# Patient Record
Sex: Male | Born: 1985 | Race: Black or African American | Hispanic: No | Marital: Single | Smoking: Never smoker
Health system: Southern US, Community
[De-identification: ages and names within clinical notes are randomized; demographics above are authoritative.]

## PROBLEM LIST (undated history)

## (undated) DIAGNOSIS — Q909 Down syndrome, unspecified: Secondary | ICD-10-CM

## (undated) DIAGNOSIS — R569 Unspecified convulsions: Secondary | ICD-10-CM

## (undated) DIAGNOSIS — Z905 Acquired absence of kidney: Secondary | ICD-10-CM

## (undated) HISTORY — PX: ABDOMINAL SURGERY: SHX537

## (undated) HISTORY — DX: Acquired absence of kidney: Z90.5

## (undated) HISTORY — DX: Unspecified convulsions: R56.9

---

## 2014-11-10 ENCOUNTER — Emergency Department (HOSPITAL_COMMUNITY): Payer: Medicaid Other

## 2014-11-10 ENCOUNTER — Emergency Department (HOSPITAL_COMMUNITY)
Admission: EM | Admit: 2014-11-10 | Discharge: 2014-11-10 | Disposition: A | Discharge status: Home / Self Care | Payer: Medicaid Other | Attending: Emergency Medicine | Admitting: Emergency Medicine

## 2014-11-10 ENCOUNTER — Encounter (HOSPITAL_COMMUNITY): Payer: Self-pay | Admitting: Emergency Medicine

## 2014-11-10 DIAGNOSIS — Q909 Down syndrome, unspecified: Secondary | ICD-10-CM | POA: Insufficient documentation

## 2014-11-10 DIAGNOSIS — R569 Unspecified convulsions: Secondary | ICD-10-CM | POA: Diagnosis present

## 2014-11-10 HISTORY — DX: Down syndrome, unspecified: Q90.9

## 2014-11-10 LAB — COMPREHENSIVE METABOLIC PANEL
ALBUMIN: 3.9 g/dL (ref 3.5–5.2)
ALK PHOS: 80 U/L (ref 39–117)
ALT: 15 U/L (ref 0–53)
AST: 26 U/L (ref 0–37)
Anion gap: 9 (ref 5–15)
BILIRUBIN TOTAL: 0.7 mg/dL (ref 0.3–1.2)
BUN: 13 mg/dL (ref 6–23)
CHLORIDE: 103 meq/L (ref 96–112)
CO2: 26 mmol/L (ref 19–32)
Calcium: 9.4 mg/dL (ref 8.4–10.5)
Creatinine, Ser: 1.31 mg/dL (ref 0.50–1.35)
GFR calc Af Amer: 86 mL/min — ABNORMAL LOW (ref >90)
GFR calc non Af Amer: 74 mL/min — ABNORMAL LOW (ref >90)
Glucose, Bld: 126 mg/dL — ABNORMAL HIGH (ref 70–99)
POTASSIUM: 4.3 mmol/L (ref 3.5–5.1)
Sodium: 138 mmol/L (ref 135–145)
Total Protein: 7 g/dL (ref 6.0–8.3)

## 2014-11-10 LAB — CBC WITH DIFFERENTIAL/PLATELET
Basophils Absolute: 0 10*3/uL (ref 0.0–0.1)
Basophils Relative: 0 % (ref 0–1)
Eosinophils Absolute: 0.1 10*3/uL (ref 0.0–0.7)
Eosinophils Relative: 1 % (ref 0–5)
HCT: 38.1 % — ABNORMAL LOW (ref 39.0–52.0)
HEMOGLOBIN: 12.7 g/dL — AB (ref 13.0–17.0)
Lymphocytes Relative: 16 % (ref 12–46)
Lymphs Abs: 1 10*3/uL (ref 0.7–4.0)
MCH: 28.2 pg (ref 26.0–34.0)
MCHC: 33.3 g/dL (ref 30.0–36.0)
MCV: 84.7 fL (ref 78.0–100.0)
MONOS PCT: 9 % (ref 3–12)
Monocytes Absolute: 0.5 10*3/uL (ref 0.1–1.0)
NEUTROS ABS: 4.6 10*3/uL (ref 1.7–7.7)
NEUTROS PCT: 74 % (ref 43–77)
Platelets: 309 10*3/uL (ref 150–400)
RBC: 4.5 MIL/uL (ref 4.22–5.81)
RDW: 15.2 % (ref 11.5–15.5)
WBC: 6.1 10*3/uL (ref 4.0–10.5)

## 2014-11-10 LAB — URINALYSIS, ROUTINE W REFLEX MICROSCOPIC
BILIRUBIN URINE: NEGATIVE
GLUCOSE, UA: NEGATIVE mg/dL
HGB URINE DIPSTICK: NEGATIVE
KETONES UR: NEGATIVE mg/dL
Leukocytes, UA: NEGATIVE
Nitrite: NEGATIVE
PH: 7.5 (ref 5.0–8.0)
Protein, ur: NEGATIVE mg/dL
Specific Gravity, Urine: 1.004 — ABNORMAL LOW (ref 1.005–1.030)
Urobilinogen, UA: 0.2 mg/dL (ref 0.0–1.0)

## 2014-11-10 LAB — CBG MONITORING, ED: Glucose-Capillary: 125 mg/dL — ABNORMAL HIGH (ref 70–99)

## 2014-11-10 MED ORDER — LORAZEPAM 2 MG/ML IJ SOLN
1.0000 mg | Freq: Once | INTRAMUSCULAR | Status: AC
  Administered 2014-11-10: 1 mg via INTRAMUSCULAR
  Filled 2014-11-10: qty 1

## 2014-11-10 NOTE — Discharge Instructions (Signed)
Please follow up closely with neurologist for further evaluation of seizure, which may include an EEG.  Return to ER if symptom return or if you have any concerns.    Seizure, Adult A seizure is abnormal electrical activity in the brain. Seizures usually last from 30 seconds to 2 minutes. There are various types of seizures. Before a seizure, you may have a warning sensation (aura) that a seizure is about to occur. An aura may include the following symptoms:   Fear or anxiety.  Nausea.  Feeling like the room is spinning (vertigo).  Vision changes, such as seeing flashing lights or spots. Common symptoms during a seizure include:  A change in attention or behavior (altered mental status).  Convulsions with rhythmic jerking movements.  Drooling.  Rapid eye movements.  Grunting.  Loss of bladder and bowel control.  Bitter taste in the mouth.  Tongue biting. After a seizure, you may feel confused and sleepy. You may also have an injury resulting from convulsions during the seizure. HOME CARE INSTRUCTIONS   If you are given medicines, take them exactly as prescribed by your health care provider.  Keep all follow-up appointments as directed by your health care provider.  Do not swim or drive or engage in risky activity during which a seizure could cause further injury to you or others until your health care provider says it is OK.  Get adequate rest.  Teach friends and family what to do if you have a seizure. They should:  Lay you on the ground to prevent a fall.  Put a cushion under your head.  Loosen any tight clothing around your neck.  Turn you on your side. If vomiting occurs, this helps keep your airway clear.  Stay with you until you recover.  Know whether or not you need emergency care. SEEK IMMEDIATE MEDICAL CARE IF:  The seizure lasts longer than 5 minutes.  The seizure is severe or you do not wake up immediately after the seizure.  You have an altered  mental status after the seizure.  You are having more frequent or worsening seizures. Someone should drive you to the emergency department or call local emergency services (911 in [place] MAKE SURE YOU:  Understand these instructions.  Will watch your condition.  Will get help right away if you are not doing well or get worse. Document Released: 11/02/2000 Document Revised: 08/26/2013 Document Reviewed: 06/17/2013 [facility] Patient Information 2015 [employer], [place]. This information is not intended to replace advice given to you by your health care provider. Make sure you discuss any questions you have with your health care provider.

## 2014-11-10 NOTE — ED Notes (Signed)
Patient returned from X-ray 

## 2014-11-10 NOTE — ED Notes (Addendum)
EMS - Witnessed by mom, patient described as having a grand-mal seizure.  Patient fell to the floor from kitchen dinette on to a carpeted surface for approximately 5 minutes.  Patient had no incontinence and no mouth injury during the episode.  No history of seizures per EMS.  Patient has down syndrome.

## 2014-11-10 NOTE — ED Notes (Signed)
Reviewed discharge instructions with mother of patient at bedside.  Taken to the waiting room in a wheelchair.

## 2014-11-10 NOTE — ED Provider Notes (Signed)
CSN: [id number]     Arrival date & time 11/10/14  1020 History   First MD Initiated Contact with Patient 11/10/14 1020     No chief complaint on file.    (Consider location/radiation/quality/duration/timing/severity/associated sxs/prior Treatment) HPI   28 year old male with history of Down syndrome who was brought here via EMS accompanied by mom for evaluation of a seizure. Per mom, patient was sitting at mid breakfast table eating breakfast approximately an hour ago when he begins to shakes violently, making gargling sounds with his voice and subsequently fell off the chair. He had a seizure episodes lasting for approximately 5 minutes follow a post ictal period. Patient has no direct injury from the fall. Difficult to assess for urinary incontinence because pt has a urostomy site.  No tongue biting.  Patient is now back to his baseline. Per mom, patient has been behaving normally leading up to today. No recent medication change, no recent sickness, he has been eating and drinking and has been active at his baseline. No history of seizure in the past.  No past medical history on file. No past surgical history on file. No family history on file. History  Substance Use Topics  . Smoking status: Not on file  . Smokeless tobacco: Not on file  . Alcohol Use: Not on file    Review of Systems  Unable to perform ROS  history of Down syndrome    Allergies  Review of patient's allergies indicates not on file.  Home Medications   Prior to Admission medications   Not on File   There were no vitals taken for this visit. Physical Exam  Constitutional:  Body stature consistence with Down syndrome, appears to be in no acute distress, interactive, smiling, and moving all 4 extremities  HENT:  Facial skin is erythematous throughout however this is at his baseline.  No hemotympanum, no septal hematoma, no malocclusion, no midface tenderness, no tongue laceration. Poor dentition.  Eyes:  Conjunctivae and EOM are normal. Pupils are equal, round, and reactive to light.  Neck: Normal range of motion. Neck supple.  No  cervical spinal tenderness on exam.  Cardiovascular: Normal rate and regular rhythm.   Pulmonary/Chest: Effort normal and breath sounds normal. No respiratory distress. He has no wheezes.  Abdominal: Soft. Bowel sounds are normal. He exhibits no distension. There is no tenderness.  Patient with a urostomy site to suprapubic region with mild surrounding skin erythema but no frank cellulitis and nontender on palpation.  Musculoskeletal:  5/5 strength all 4 extremities, able to move all extremities without difficulty.  Neurological: He is alert.  Nursing note and vitals reviewed.   ED Course  Procedures (including critical care time)  10:48 AM Patient with history of Down syndrome here for a new onset seizure activities, which appears to be grand mal seizure. He is at his baseline. Workup initiated.  2:48 PM Patient has been at his baseline. He is resting comfortably. No recurrent seizure. UA shows no evidence of urinary tract infection, CBG unremarkable, blood work is reassuring, electrolytes are within normal limits, chest x-ray shows no evidence of infection, and a head CT scan is unremarkable. Plan to have patient follow-up with neurology for further evaluation of his seizure which may including an EEG. Family made aware that if seizure episodes return to bring patient back to the ER for further evaluation. Care discussed with Dr. Judd Lienelo.  Labs Review Labs Reviewed  CBC WITH DIFFERENTIAL - Abnormal; Notable for the following:  Hemoglobin 12.7 (*)    HCT 38.1 (*)    All other components within normal limits  COMPREHENSIVE METABOLIC PANEL - Abnormal; Notable for the following:    Glucose, Bld 126 (*)    GFR calc non Af Amer 74 (*)    GFR calc Af Amer 86 (*)    All other components within normal limits  URINALYSIS, ROUTINE W REFLEX MICROSCOPIC - Abnormal;  Notable for the following:    Color, Urine COLORLESS (*)    Specific Gravity, Urine 1.004 (*)    All other components within normal limits  CBG MONITORING, ED - Abnormal; Notable for the following:    Glucose-Capillary 125 (*)    All other components within normal limits    Imaging Review Dg Chest 2 View  11/10/2014   CLINICAL DATA:  Two week history of cough.  Seizure  EXAM: CHEST  2 VIEW  COMPARISON:  None.  FINDINGS: The lungs are clear. The heart size and pulmonary vascularity are normal. No adenopathy. No bone lesions. Rudimentary cervical ribs are noted bilaterally.  IMPRESSION: No edema or consolidation.   Electronically Signed   By: Bretta BangWilliam  Woodruff M.D.   On: 11/10/2014 13:25   Ct Head Wo Contrast  11/10/2014   CLINICAL DATA:  Grand mal seizure beginning today. No history of seizures prior to this. Down syndrome.  EXAM: CT HEAD WITHOUT CONTRAST  TECHNIQUE: Contiguous axial images were obtained from the base of the skull through the vertex without intravenous contrast.  COMPARISON:  None.  FINDINGS: The patient was unable to remain motionless for the exam. Small or subtle lesions could be overlooked.  No evidence for acute infarction, hemorrhage, mass lesion, hydrocephalus, or extra-axial fluid. No definite atrophy or white matter disease. Calvarium intact. No sinus air-fluid levels are observed.  IMPRESSION: Negative exam.   Electronically Signed   By: Davonna BellingJohn  Curnes M.D.   On: 11/10/2014 12:08     EKG Interpretation None      MDM   Final diagnoses:  Seizure    BP 118/74 mmHg  Pulse 101  Temp(Src) 99 F (37.2 C) (Oral)  Resp 18  Ht 5\' 1"  (1.549 m)  Wt 93 lb (42.185 kg)  BMI 17.58 kg/m2  SpO2 98%  I have reviewed nursing notes and vital signs. I personally reviewed the imaging tests through PACS system  I reviewed available ER/hospitalization records thought the EMR     Fayrene HelperBowie Cattie Tineo, PA-C 11/10/14 1450  Geoffery Lyonsouglas Delo, MD 11/10/14 [phone]

## 2014-11-10 NOTE — ED Notes (Signed)
PA Bowie at bedside  °

## 2014-11-16 ENCOUNTER — Ambulatory Visit (INDEPENDENT_AMBULATORY_CARE_PROVIDER_SITE_OTHER): Payer: Medicaid Other | Admitting: Diagnostic Neuroimaging

## 2014-11-16 ENCOUNTER — Encounter: Payer: Self-pay | Admitting: Diagnostic Neuroimaging

## 2014-11-16 VITALS — BP 128/79 | HR 98 | Ht 61.0 in | Wt 91.8 lb

## 2014-11-16 DIAGNOSIS — R569 Unspecified convulsions: Secondary | ICD-10-CM

## 2014-11-16 DIAGNOSIS — Q909 Down syndrome, unspecified: Secondary | ICD-10-CM

## 2014-11-16 NOTE — Progress Notes (Signed)
[facility]  PATIENT: Isaac Murphy DOB: 02/03/1986  REFERRING CLINICIAN: ER referral  HISTORY FROM: patient, mother, home nurse REASON FOR VISIT: new consult    HISTORICAL  CHIEF COMPLAINT:  Chief Complaint  Patient presents with  . Seizures    HISTORY OF PRESENT ILLNESS:   28 year old male with history of Down syndrome, congenital solitary kidney, here for evaluation of seizure. No right or history of seizures until 11/10/14, patient was eating breakfast when all of a sudden he made gurgling sounds, had convulsions, fell out of his chair. Seizure lasted for 5 minutes. Patient did have tongue biting with the seizure. He was confused, dazed, for another 15-20 minutes. Patient was gradually returned to baseline by the time he was loaded into the ambulance and came to the emergency room. No recent sleep changes, infections, stresses, traumas.     REVIEW OF SYSTEMS: Full 14 system review of systems performed and notable only for only as per history of present illness.  ALLERGIES: No Known Allergies  HOME MEDICATIONS: No outpatient prescriptions prior to visit.   No facility-administered medications prior to visit.    PAST MEDICAL HISTORY: Past Medical History  Diagnosis Date  . Down syndrome   . H/O unilateral nephrectomy     PAST SURGICAL HISTORY: Past Surgical History  Procedure Laterality Date  . Abdominal surgery      FAMILY HISTORY: Family History  Problem Relation Age of Onset  . Diabetes Mother     SOCIAL HISTORY:  History   Social History  . Marital Status: Single    Spouse Name: N/A    Number of Children: 0  . Years of Education: HS   Occupational History  .  Other    N/A   Social History Main Topics  . Smoking status: Never Smoker   . Smokeless tobacco: Never Used  . Alcohol Use: No  . Drug Use: No  . Sexual Activity: Not on file   Other Topics Concern  . Not on file   Social History Narrative   Patient lives at  home with his family.   Caffeine Use: none     PHYSICAL EXAM  Filed Vitals:   11/16/14 1321  BP: 128/79  Pulse: 98  Height: 5\' 1"  (1.549 m)  Weight: 91 lb 12.8 oz (41.64 kg)    Body mass index is 17.35 kg/(m^2).  No exam data present  No flowsheet data found.  GENERAL EXAM: Patient is in no distress; well developed, nourished and groomed; neck is supple; DOWN FACIES; PLEASANT, SMILING, HYPERKINETIC  CARDIOVASCULAR: Regular rate and rhythm, no murmurs, no carotid bruits  NEUROLOGIC: MENTAL STATUS: awake, alert; SMILING, RESPONDS IN SHORT SENTENCES, PHRASES. FOLLOWS SIMPLE COMMANDS. CRANIAL NERVE: pupils equal and reactive to light, visual fields full to confrontation, extraocular muscles intact, no nystagmus, facial sensation and strength symmetric, hearing intact, palate elevates symmetrically, uvula midline, shoulder shrug symmetric, tongue midline. MOTOR: normal bulk and tone, full strength in the BUE, BLE SENSORY: normal and symmetric to light touch, vibration and temperature COORDINATION: finger-nose-finger, fine finger movements normal REFLEXES: deep tendon reflexes TRACE and symmetric GAIT/STATION: narrow based gait; romberg is negative    DIAGNOSTIC DATA (LABS, IMAGING, TESTING) - I reviewed patient records, labs, notes, testing and imaging myself where available.  Lab Results  Component Value Date   WBC 6.1 11/10/2014   HGB 12.7* 11/10/2014   HCT 38.1* 11/10/2014   MCV 84.7 11/10/2014   PLT 309 11/10/2014      Component Value  Date/Time   NA 138 11/10/2014 1125   K 4.3 11/10/2014 1125   CL 103 11/10/2014 1125   CO2 26 11/10/2014 1125   GLUCOSE 126* 11/10/2014 1125   BUN 13 11/10/2014 1125   CREATININE 1.31 11/10/2014 1125   CALCIUM 9.4 11/10/2014 1125   PROT 7.0 11/10/2014 1125   ALBUMIN 3.9 11/10/2014 1125   AST 26 11/10/2014 1125   ALT 15 11/10/2014 1125   ALKPHOS 80 11/10/2014 1125   BILITOT 0.7 11/10/2014 1125   GFRNONAA 74* 11/10/2014 1125     GFRAA 86* 11/10/2014 1125   No results found for: CHOL, HDL, LDLCALC, LDLDIRECT, TRIG, CHOLHDL No results found for: ZOXW9UHGBA1C No results found for: VITAMINB12 No results found for: TSH   I reviewed images myself and agree with interpretation. -VRP  11/10/14 CT HEAD - negative    ASSESSMENT AND PLAN  28 y.o. year old male here with:  Dx: new onset unprovoked seizure (11/10/14) + Down syndrome  PLAN: - EEG; then possible anti-seizure medication depending on results - seizure precautions reviewed  Orders Placed This Encounter  Procedures  . EEG adult   Return in about 3 months (around 02/15/2015).    Suanne MarkerVIKRAM R. Honesti Seaberg, MD 11/16/2014, 2:26 PM Certified in Neurology, Neurophysiology and Neuroimaging  [facility] [address], [address] [place], [place] [address] [phone]

## 2014-11-16 NOTE — Patient Instructions (Signed)
 -   Please maintain seizure precautions. Do not participate in activities where a loss of awareness could harm you or someone else. No swimming alone, no tub bathing, no hot tubs, no driving, no operating motorized vehicles (cars, ATVs, motocycles, etc), lawnmowers or power tools. No standing at heights, such as rooftops, ladders or stairs. Avoid hot objects such as stoves, heaters, open fires. Wear a helmet when riding a bicycle, scooter, skateboard, etc. and avoid areas of traffic. Set your water heater to 120 degrees or less.

## 2014-11-25 NOTE — Procedures (Signed)
   [facility]  EEG (ELECTROENCEPHALOGRAM) REPORT   STUDY DATE: 11/16/14  PATIENT NAME: Isaac PetersDeon Murphy DOB: 04/17/1986 MRN: [id number]  ORDERING CLINICIAN: Joycelyn SchmidVikram Tobin Witucki, MD   TECHNOLOGIST: Gearldine ShownLorraine Jones  TECHNIQUE: Electroencephalogram was recorded utilizing standard 10-20 system of lead placement and reformatted into average and bipolar montages.  RECORDING TIME: 26 minutes  ACTIVATION: photic stimulation  CLINICAL INFORMATION: 29 year old male with new onset seizure.  FINDINGS: Muscle and motion artifact noted. Background rhythms of 10-11 hertz and 90-100 microvolts. No focal, lateralizing, epileptiform activity or seizures are seen. Patient recorded in the awake state. EKG channel shows regular rhythm of 72 beats per minutes.  IMPRESSION:  Normal EEG in the awake state.   INTERPRETING PHYSICIAN:  Suanne MarkerVIKRAM R. Caeli Linehan, MD Certified in Neurology, Neurophysiology and Neuroimaging  [facility] [address] [place], [place] [address] [phone]

## 2014-12-13 ENCOUNTER — Telehealth: Payer: Self-pay | Admitting: Diagnostic Neuroimaging

## 2014-12-13 NOTE — Telephone Encounter (Signed)
Patient's mother requesting EEG results.  Please call and advise.

## 2014-12-14 ENCOUNTER — Telehealth: Payer: Self-pay | Admitting: *Deleted

## 2014-12-14 NOTE — Telephone Encounter (Signed)
Spoke with patients mother on the phone, informed her per Dr. Richrd HumblesPenumalli's request, that the EEG results were normal. Pts mother stated understanding.  Asked to make a follow-up appt, but received another call and said she would call back.

## 2014-12-20 DIAGNOSIS — R569 Unspecified convulsions: Secondary | ICD-10-CM

## 2014-12-20 HISTORY — DX: Unspecified convulsions: R56.9

## 2015-01-04 ENCOUNTER — Encounter (HOSPITAL_COMMUNITY): Payer: Self-pay | Admitting: Emergency Medicine

## 2015-01-04 ENCOUNTER — Emergency Department (HOSPITAL_COMMUNITY)
Admission: EM | Admit: 2015-01-04 | Discharge: 2015-01-04 | Disposition: A | Discharge status: Home / Self Care | Payer: Medicaid Other | Attending: Emergency Medicine | Admitting: Emergency Medicine

## 2015-01-04 DIAGNOSIS — R569 Unspecified convulsions: Secondary | ICD-10-CM | POA: Insufficient documentation

## 2015-01-04 DIAGNOSIS — Q909 Down syndrome, unspecified: Secondary | ICD-10-CM | POA: Diagnosis not present

## 2015-01-04 DIAGNOSIS — Z905 Acquired absence of kidney: Secondary | ICD-10-CM | POA: Insufficient documentation

## 2015-01-04 LAB — CBC WITH DIFFERENTIAL/PLATELET
Basophils Absolute: 0 10*3/uL (ref 0.0–0.1)
Basophils Relative: 1 % (ref 0–1)
Eosinophils Absolute: 0 10*3/uL (ref 0.0–0.7)
Eosinophils Relative: 1 % (ref 0–5)
HCT: 39.9 % (ref 39.0–52.0)
HEMOGLOBIN: 13.4 g/dL (ref 13.0–17.0)
LYMPHS ABS: 0.8 10*3/uL (ref 0.7–4.0)
Lymphocytes Relative: 16 % (ref 12–46)
MCH: 28.4 pg (ref 26.0–34.0)
MCHC: 33.6 g/dL (ref 30.0–36.0)
MCV: 84.5 fL (ref 78.0–100.0)
MONO ABS: 0.5 10*3/uL (ref 0.1–1.0)
MONOS PCT: 9 % (ref 3–12)
NEUTROS ABS: 3.8 10*3/uL (ref 1.7–7.7)
NEUTROS PCT: 73 % (ref 43–77)
Platelets: 326 10*3/uL (ref 150–400)
RBC: 4.72 MIL/uL (ref 4.22–5.81)
RDW: 14.9 % (ref 11.5–15.5)
WBC: 5.2 10*3/uL (ref 4.0–10.5)

## 2015-01-04 LAB — COMPREHENSIVE METABOLIC PANEL
ALT: 14 U/L (ref 0–53)
AST: 23 U/L (ref 0–37)
Albumin: 4 g/dL (ref 3.5–5.2)
Alkaline Phosphatase: 88 U/L (ref 39–117)
Anion gap: 5 (ref 5–15)
BUN: 11 mg/dL (ref 6–23)
CALCIUM: 9.4 mg/dL (ref 8.4–10.5)
CO2: 29 mmol/L (ref 19–32)
Chloride: 103 mmol/L (ref 96–112)
Creatinine, Ser: 1.17 mg/dL (ref 0.50–1.35)
GFR calc non Af Amer: 85 mL/min — ABNORMAL LOW (ref >90)
Glucose, Bld: 101 mg/dL — ABNORMAL HIGH (ref 70–99)
Potassium: 3.5 mmol/L (ref 3.5–5.1)
Sodium: 137 mmol/L (ref 135–145)
Total Bilirubin: 0.7 mg/dL (ref 0.3–1.2)
Total Protein: 7.6 g/dL (ref 6.0–8.3)

## 2015-01-04 MED ORDER — LEVETIRACETAM IN NACL 1000 MG/100ML IV SOLN
1000.0000 mg | Freq: Once | INTRAVENOUS | Status: AC
  Administered 2015-01-04: 1000 mg via INTRAVENOUS
  Filled 2015-01-04 (×2): qty 100

## 2015-01-04 MED ORDER — LORAZEPAM 2 MG/ML IJ SOLN
2.0000 mg | Freq: Once | INTRAMUSCULAR | Status: AC
  Administered 2015-01-04: 2 mg via INTRAMUSCULAR
  Filled 2015-01-04: qty 1

## 2015-01-04 MED ORDER — LEVETIRACETAM 100 MG/ML PO SOLN: 500.0000 mg | Freq: Two times a day (BID) | ORAL | Status: DC

## 2015-01-04 NOTE — ED Notes (Signed)
Currently waiting for ativan to take effect in order to safely place IV and start Keppra.

## 2015-01-04 NOTE — Discharge Instructions (Signed)
Follow-up with [facility]. New prescription Keppra, morning and evening.  Seizure, Adult A seizure is abnormal electrical activity in the brain. Seizures usually last from 30 seconds to 2 minutes. There are various types of seizures. Before a seizure, you may have a warning sensation (aura) that a seizure is about to occur. An aura may include the following symptoms:   Fear or anxiety.  Nausea.  Feeling like the room is spinning (vertigo).  Vision changes, such as seeing flashing lights or spots. Common symptoms during a seizure include:  A change in attention or behavior (altered mental status).  Convulsions with rhythmic jerking movements.  Drooling.  Rapid eye movements.  Grunting.  Loss of bladder and bowel control.  Bitter taste in the mouth.  Tongue biting. After a seizure, you may feel confused and sleepy. You may also have an injury resulting from convulsions during the seizure. HOME CARE INSTRUCTIONS   If you are given medicines, take them exactly as prescribed by your health care provider.  Keep all follow-up appointments as directed by your health care provider.  Do not swim or drive or engage in risky activity during which a seizure could cause further injury to you or others until your health care provider says it is OK.  Get adequate rest.  Teach friends and family what to do if you have a seizure. They should:  Lay you on the ground to prevent a fall.  Put a cushion under your head.  Loosen any tight clothing around your neck.  Turn you on your side. If vomiting occurs, this helps keep your airway clear.  Stay with you until you recover.  Know whether or not you need emergency care. SEEK IMMEDIATE MEDICAL CARE IF:  The seizure lasts longer than 5 minutes.  The seizure is severe or you do not wake up immediately after the seizure.  You have an altered mental status after the seizure.  You are having more frequent or worsening  seizures. Someone should drive you to the emergency department or call local emergency services (911 in [place] MAKE SURE YOU:  Understand these instructions.  Will watch your condition.  Will get help right away if you are not doing well or get worse. Document Released: 11/02/2000 Document Revised: 08/26/2013 Document Reviewed: 06/17/2013 [facility] Patient Information 2015 [employer], [place]. This information is not intended to replace advice given to you by your health care provider. Make sure you discuss any questions you have with your health care provider.

## 2015-01-04 NOTE — ED Notes (Signed)
Pt is in stable condition upon d/c and returns home with family. Pt family verbalizes understanding rt d/c medications and instructions.

## 2015-01-04 NOTE — ED Provider Notes (Signed)
CSN: [id number]     Arrival date & time 01/04/15  1012 History   First MD Initiated Contact with Patient 01/04/15 1016     Chief Complaint  Patient presents with  . Seizures     HPI  Patient presents for evaluation after a seizure at home. Residual with mom. Has a nurse at home as well. History of Down syndrome. One prior previous seizure in December. Had normal imaging. Had follow-up with neurology. Had normal outpatient EEG. No additional seizures until this morning. He just finished breakfast and was sitting at the table when he "drew up his arms and legs" he describes fairly classic seizure activity. He was held by his nurse and laid on the floor. Did not have injury. Seizure less than a few minutes. Postictal upon arrival paramedics. Awakening in route. Upon mom's arrival short time after patient's arrival she feels he is at his baseline. No incontinence. No tongue biting.  No recent injury or illness. Does not take medications at home.  Past Medical History  Diagnosis Date  . Down syndrome   . H/O unilateral nephrectomy    Past Surgical History  Procedure Laterality Date  . Abdominal surgery     Family History  Problem Relation Age of Onset  . Diabetes Mother    History  Substance Use Topics  . Smoking status: Never Smoker   . Smokeless tobacco: Never Used  . Alcohol Use: No    Review of Systems  Constitutional: Negative for fever, chills, diaphoresis, appetite change and fatigue.  HENT: Negative for mouth sores, sore throat and trouble swallowing.   Eyes: Negative for visual disturbance.  Respiratory: Negative for cough, chest tightness, shortness of breath and wheezing.   Cardiovascular: Negative for chest pain.  Gastrointestinal: Negative for nausea, vomiting, abdominal pain, diarrhea and abdominal distention.  Endocrine: Negative for polydipsia, polyphagia and polyuria.  Genitourinary: Negative for dysuria, frequency and hematuria.  Musculoskeletal: Negative for  gait problem.  Skin: Negative for color change, pallor and rash.  Neurological: Positive for seizures. Negative for dizziness, syncope, light-headedness and headaches.  Hematological: Does not bruise/bleed easily.  Psychiatric/Behavioral: Negative for behavioral problems and confusion.      Allergies  Review of patient's allergies indicates no known allergies.  Home Medications   Prior to Admission medications   Medication Sig Start Date End Date Taking? Authorizing Provider  levETIRAcetam (KEPPRA) 100 MG/ML solution Take 5 mLs (500 mg total) by mouth 2 (two) times daily. 01/04/15 01/25/15  Rolland Porter, MD   BP 99/59 mmHg  Pulse 69  Temp(Src) 98.4 F (36.9 C) (Oral)  Resp 16  SpO2 97% Physical Exam  Constitutional: He is oriented to person, place, and time. He appears well-developed and well-nourished. No distress.  HENT:  Head: Normocephalic.  Downs feces with large protruding tongue. No tongue contusion or laceration.  Eyes: Conjunctivae are normal. Pupils are equal, round, and reactive to light. No scleral icterus.  Neck: Normal range of motion. Neck supple. No thyromegaly present.  Cardiovascular: Normal rate and regular rhythm.  Exam reveals no gallop and no friction rub.   No murmur heard. Pulmonary/Chest: Effort normal and breath sounds normal. No respiratory distress. He has no wheezes. He has no rales.  Abdominal: Soft. Bowel sounds are normal. He exhibits no distension. There is no tenderness. There is no rebound.  Musculoskeletal: Normal range of motion.  Neurological: He is alert and oriented to person, place, and time.  1-2 word answers. Moves all 4 extremities with no apparent  abnormalities.  Skin: Skin is warm and dry. No rash noted.  Psychiatric: He has a normal mood and affect. His behavior is normal.    ED Course  Procedures (including critical care time) Labs Review Labs Reviewed  COMPREHENSIVE METABOLIC PANEL - Abnormal; Notable for the following:     Glucose, Bld 101 (*)    GFR calc non Af Amer 85 (*)    All other components within normal limits  CBC WITH DIFFERENTIAL/PLATELET    Imaging Review No results found.   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 Think he'll need initiated on medication. I feel obligated that she look at liver and renal function before starting a long-term medication. He is given some IM Ativan to help facilitate IV placement for Keppra loading and lab draws.  Labs reassuring. Given IV Keppra. Plan is discharge home. New prescriptions Keppra twice a day. Neurological follow-up for neurological.    Rolland PorterMark Lore Polka, MD 01/04/15 1327

## 2015-01-04 NOTE — ED Notes (Signed)
Pt has a small bump on left side of head with some redness and swelling.

## 2015-01-04 NOTE — ED Notes (Signed)
Per EMS, patient had 2 minute grand mal seizure this morning while home nurse there.   Seizure described as tonic clonic seizure as he was getting ready to eat breakfast.   Patient had first seizure in December, with this being his second today.   Patient does have Down's syndrome and was back at baseline approximately 3 minutes after seizure.   No injury per EMS.   Patient states no injury or pain.   Patient vitals were normal with EMS 136/94, Normal Sinus Rhythm, CBG 88.   No incontinence at all per EMS.   Per EMS, patient may have bit inside of lower lip.   Patient denies.

## 2015-06-23 ENCOUNTER — Ambulatory Visit (INDEPENDENT_AMBULATORY_CARE_PROVIDER_SITE_OTHER): Payer: Medicaid Other | Admitting: Diagnostic Neuroimaging

## 2015-06-23 ENCOUNTER — Encounter: Payer: Self-pay | Admitting: Diagnostic Neuroimaging

## 2015-06-23 VITALS — BP 117/83 | HR 88 | Ht 61.0 in | Wt 99.8 lb

## 2015-06-23 DIAGNOSIS — G40209 Localization-related (focal) (partial) symptomatic epilepsy and epileptic syndromes with complex partial seizures, not intractable, without status epilepticus: Secondary | ICD-10-CM

## 2015-06-23 DIAGNOSIS — Q909 Down syndrome, unspecified: Secondary | ICD-10-CM | POA: Insufficient documentation

## 2015-06-23 MED ORDER — LEVETIRACETAM 100 MG/ML PO SOLN: 1000.0000 mg | Freq: Two times a day (BID) | ORAL | Status: DC

## 2015-06-23 NOTE — Progress Notes (Signed)
[facility]  PATIENT: Isaac Murphy DOB: September 18, 1986  REFERRING CLINICIAN:  HISTORY FROM: patient, mother  REASON FOR VISIT:    HISTORICAL  CHIEF COMPLAINT:  Chief Complaint  Patient presents with  . Seizures    rm 8, mother - Hiram Gash  . Follow-up    "seizure Feb, April, June, July"    HISTORY OF PRESENT ILLNESS:   UPDATE 06/23/15: Since last visit, was doing well until Feb 2016 (had 2nd seizure). Then went to ER and started on LEV 500mg  BID. Then had addl sz in April, June and July 2016. Some warning, as patient would go to sofa and brace himself, then mouth and face twitching, then generalized convulsions, gasping for breath, then stopped. Confusion for 1-2 hours, then returns to baseline. Tolerating LEV.   PRIOR HPI (11/16/14): 29 year old male with history of Down syndrome, congenital solitary kidney, here for evaluation of seizure. No right or history of seizures until 11/10/14, patient was eating breakfast when all of a sudden he made gurgling sounds, had convulsions, fell out of his chair. Seizure lasted for 5 minutes. Patient did have tongue biting with the seizure. He was confused, dazed, for another 15-20 minutes. Patient was gradually returned to baseline by the time he was loaded into the ambulance and came to the emergency room. No recent sleep changes, infections, stresses, traumas.     REVIEW OF SYSTEMS: Full 14 system review of systems performed and notable only for only as per history of present illness.  ALLERGIES: No Known Allergies  HOME MEDICATIONS: Outpatient Prescriptions Prior to Visit  Medication Sig Dispense Refill  . levETIRAcetam (KEPPRA) 100 MG/ML solution Take 5 mLs (500 mg total) by mouth 2 (two) times daily. 600 mL 0   No facility-administered medications prior to visit.    PAST MEDICAL HISTORY: Past Medical History  Diagnosis Date  . Down syndrome   . H/O unilateral nephrectomy   . Seizures 12/2014    PAST  SURGICAL HISTORY: Past Surgical History  Procedure Laterality Date  . Abdominal surgery      FAMILY HISTORY: Family History  Problem Relation Age of Onset  . Diabetes Mother     SOCIAL HISTORY:  History   Social History  . Marital Status: Single    Spouse Name: N/A  . Number of Children: 0  . Years of Education: HS   Occupational History  .  Other    N/A   Social History Main Topics  . Smoking status: Never Smoker   . Smokeless tobacco: Never Used  . Alcohol Use: No  . Drug Use: No  . Sexual Activity: Not on file   Other Topics Concern  . Not on file   Social History Narrative   Patient lives at home with his family.   Caffeine Use: none     PHYSICAL EXAM  Filed Vitals:   06/23/15 1538  BP: 117/83  Pulse: 88  Height: 5\' 1"  (1.549 m)  Weight: 99 lb 12.8 oz (45.269 kg)    Body mass index is 18.87 kg/(m^2).  No exam data present  No flowsheet data found.  GENERAL EXAM: Patient is in no distress; well developed, nourished and groomed; neck is supple; DOWN FACIES; PLEASANT, SMILING, HYPERKINETIC  CARDIOVASCULAR: Regular rate and rhythm, no murmurs, no carotid bruits  NEUROLOGIC: MENTAL STATUS: awake, alert; SMILING, RESPONDS IN SHORT SENTENCES, PHRASES. FOLLOWS SIMPLE COMMANDS. CRANIAL NERVE: pupils equal and reactive to light, visual fields full to confrontation, extraocular muscles intact, no nystagmus, facial  sensation and strength symmetric, hearing intact, palate elevates symmetrically, uvula midline, shoulder shrug symmetric, tongue midline. MOTOR: normal bulk and tone, full strength in the BUE, BLE SENSORY: normal and symmetric to light touch, vibration and temperature COORDINATION: finger-nose-finger, fine finger movements normal REFLEXES: deep tendon reflexes TRACE and symmetric GAIT/STATION: narrow based gait; romberg is negative    DIAGNOSTIC DATA (LABS, IMAGING, TESTING) - I reviewed patient records, labs, notes, testing and imaging  myself where available.  Lab Results  Component Value Date   WBC 5.2 01/04/2015   HGB 13.4 01/04/2015   HCT 39.9 01/04/2015   MCV 84.5 01/04/2015   PLT 326 01/04/2015      Component Value Date/Time   NA 137 01/04/2015 1140   K 3.5 01/04/2015 1140   CL 103 01/04/2015 1140   CO2 29 01/04/2015 1140   GLUCOSE 101* 01/04/2015 1140   BUN 11 01/04/2015 1140   CREATININE 1.17 01/04/2015 1140   CALCIUM 9.4 01/04/2015 1140   PROT 7.6 01/04/2015 1140   ALBUMIN 4.0 01/04/2015 1140   AST 23 01/04/2015 1140   ALT 14 01/04/2015 1140   ALKPHOS 88 01/04/2015 1140   BILITOT 0.7 01/04/2015 1140   GFRNONAA 85* 01/04/2015 1140   GFRAA >90 01/04/2015 1140   No results found for: CHOL, HDL, LDLCALC, LDLDIRECT, TRIG, CHOLHDL No results found for: ZOXW9U No results found for: VITAMINB12 No results found for: TSH   11/10/14 CT HEAD - negative  11/25/14 EEG - normal    ASSESSMENT AND PLAN  29 y.o. year old male here with multiple breakthrough seizures in Feb, April, June and July 2016.   Dx:  Partial symptomatic epilepsy with complex partial seizures, not intractable, without status epilepticus  Down syndrome    PLAN: - increase LEV to  BID (liquid suspension) - seizure precautions reviewed  Meds ordered this encounter  Medications  . DISCONTD: levETIRAcetam (KEPPRA) 100 MG/ML solution    Sig: TK 5 ML PO BID    Refill:  4  . levETIRAcetam (KEPPRA) 100 MG/ML solution    Sig: Take 10 mLs (1,000 mg total) by mouth 2 (two) times daily.    Dispense:  473 mL    Refill:  4   Return in about 3 months (around 09/23/2015).    Suanne Marker, MD 06/23/2015, 4:16 PM Certified in Neurology, Neurophysiology and Neuroimaging  [facility] [address] [place], [place] [address] [phone]

## 2015-06-23 NOTE — Patient Instructions (Signed)
Increase levetiracetam up to 10ml twice a day (equals  twice a day).

## 2015-08-16 ENCOUNTER — Other Ambulatory Visit: Payer: Self-pay | Admitting: Diagnostic Neuroimaging

## 2015-09-06 ENCOUNTER — Telehealth: Payer: Self-pay | Admitting: Diagnostic Neuroimaging

## 2015-09-06 NOTE — Telephone Encounter (Signed)
Pt's mother called requesting documentation that pt has seizures and has down syndrome for respite care. This should be faxed to [facility] [phone] attn Annice PihJackie. Please call and advise [phone].

## 2015-09-07 NOTE — Telephone Encounter (Signed)
Spoke with patient's mother and informed her that information was faxed to [facility] per her request. Confirmed patient's FU in two weeks. She verbalized understanding.

## 2015-09-11 ENCOUNTER — Other Ambulatory Visit: Payer: Self-pay | Admitting: Diagnostic Neuroimaging

## 2015-09-14 ENCOUNTER — Telehealth: Payer: Self-pay | Admitting: Diagnostic Neuroimaging

## 2015-09-14 NOTE — Telephone Encounter (Signed)
I called the pharmacy back.  The patient has been getting Keppra with [employer] manufacturer, however this is no longer available, so they will need to dispense [employer] manufacturer instead.  The drug and dose will remain the same.  They are required to advise us of this change since it is a seizure med.

## 2015-09-14 NOTE — Telephone Encounter (Signed)
Noted. Thanks. -VRP 

## 2015-09-14 NOTE — Telephone Encounter (Signed)
[facility] pharmacy is call to see if they can change manufactures on medication levETIRAcetam (KEPPRA) 100 MG/ML solution . Please call and advise [phone],

## 2015-09-26 ENCOUNTER — Ambulatory Visit (INDEPENDENT_AMBULATORY_CARE_PROVIDER_SITE_OTHER): Payer: Medicaid Other | Admitting: Diagnostic Neuroimaging

## 2015-09-26 ENCOUNTER — Encounter: Payer: Self-pay | Admitting: Diagnostic Neuroimaging

## 2015-09-26 VITALS — BP 109/74 | HR 79 | Ht 61.0 in | Wt 100.0 lb

## 2015-09-26 DIAGNOSIS — G40209 Localization-related (focal) (partial) symptomatic epilepsy and epileptic syndromes with complex partial seizures, not intractable, without status epilepticus: Secondary | ICD-10-CM

## 2015-09-26 DIAGNOSIS — Q909 Down syndrome, unspecified: Secondary | ICD-10-CM | POA: Diagnosis not present

## 2015-09-26 MED ORDER — LEVETIRACETAM 100 MG/ML PO SOLN: 1500.0000 mg | Freq: Two times a day (BID) | ORAL | Status: DC

## 2015-09-26 NOTE — Progress Notes (Signed)
[facility]  PATIENT: Isaac Murphy DOB: 03/31/1986  REFERRING CLINICIAN:  HISTORY FROM: patient, mother  REASON FOR VISIT:    HISTORICAL  CHIEF COMPLAINT:  Chief Complaint  Patient presents with  . Epilepsy    rm 8, mother- Hiram Gash  . Follow-up    3 month    HISTORY OF PRESENT ILLNESS:   UPDATE 09/26/15: Since last visit, was doing well until 09/17/15 (breakthrough sz). Tolerating incr LEV  BID dose.   UPDATE 06/23/15: Since last visit, was doing well until Feb 2016 (had 2nd seizure). Then went to ER and started on LEV  BID. Then had addl sz in April, June and July 2016. Some warning, as patient would go to sofa and brace himself, then mouth and face twitching, then generalized convulsions, gasping for breath, then stopped. Confusion for 1-2 hours, then returns to baseline. Tolerating LEV.   PRIOR HPI (11/16/14): 29 year old male with history of Down syndrome, congenital solitary kidney, here for evaluation of seizure. No right or history of seizures until 11/10/14, patient was eating breakfast when all of a sudden he made gurgling sounds, had convulsions, fell out of his chair. Seizure lasted for 5 minutes. Patient did have tongue biting with the seizure. He was confused, dazed, for another 15-20 minutes. Patient was gradually returned to baseline by the time he was loaded into the ambulance and came to the emergency room. No recent sleep changes, infections, stresses, traumas.    REVIEW OF SYSTEMS: Full 14 system review of systems performed and notable only for only as per history of present illness.  ALLERGIES: No Known Allergies  HOME MEDICATIONS: Outpatient Prescriptions Prior to Visit  Medication Sig Dispense Refill  . levETIRAcetam (KEPPRA) 100 MG/ML solution TAKE 10 ML(1000 MG) BY MOUTH TWICE DAILY 1800 mL 2   No facility-administered medications prior to visit.    PAST MEDICAL HISTORY: Past Medical History  Diagnosis Date  .  Down syndrome   . H/O unilateral nephrectomy   . Seizures (HCC) 12/2014    last sz 09/17/15    PAST SURGICAL HISTORY: Past Surgical History  Procedure Laterality Date  . Abdominal surgery      FAMILY HISTORY: Family History  Problem Relation Age of Onset  . Diabetes Mother     SOCIAL HISTORY:  Social History   Social History  . Marital Status: Single    Spouse Name: N/A  . Number of Children: 0  . Years of Education: HS   Occupational History  .  Other    N/A   Social History Main Topics  . Smoking status: Never Smoker   . Smokeless tobacco: Never Used  . Alcohol Use: No  . Drug Use: No  . Sexual Activity: Not on file   Other Topics Concern  . Not on file   Social History Narrative   Patient lives at home with his family.   Caffeine Use: none     PHYSICAL EXAM  Filed Vitals:   09/26/15 1459  BP: 109/74  Pulse: 79  Height:  (1.549 m)  Weight: 100 lb (45.36 kg)    Body mass index is 18.9 kg/(m^2).  No exam data present  No flowsheet data found.  GENERAL EXAM: Patient is in no distress; well developed, nourished and groomed; neck is supple; DOWN FACIES; PLEASANT, SMILING, HYPERKINETIC  CARDIOVASCULAR: Regular rate and rhythm, no murmurs, no carotid bruits  NEUROLOGIC: MENTAL STATUS: awake, alert; SMILING, RESPONDS IN SHORT SENTENCES, PHRASES. FOLLOWS SIMPLE COMMANDS. CRANIAL NERVE:  pupils equal and reactive to light, visual fields full to confrontation, extraocular muscles intact, no nystagmus, facial sensation and strength symmetric, hearing intact, palate elevates symmetrically, uvula midline, shoulder shrug symmetric, tongue midline. MOTOR: normal bulk and tone, full strength in the BUE, BLE SENSORY: normal and symmetric to light touch, vibration and temperature COORDINATION: finger-nose-finger, fine finger movements normal REFLEXES: deep tendon reflexes TRACE and symmetric GAIT/STATION: narrow based gait; romberg is  negative    DIAGNOSTIC DATA (LABS, IMAGING, TESTING) - I reviewed patient records, labs, notes, testing and imaging myself where available.  Lab Results  Component Value Date   WBC 5.2 01/04/2015   HGB 13.4 01/04/2015   HCT 39.9 01/04/2015   MCV 84.5 01/04/2015   PLT 326 01/04/2015      Component Value Date/Time   NA 137 01/04/2015 1140   K 3.5 01/04/2015 1140   CL 103 01/04/2015 1140   CO2 29 01/04/2015 1140   GLUCOSE 101* 01/04/2015 1140   BUN 11 01/04/2015 1140   CREATININE 1.17 01/04/2015 1140   CALCIUM 9.4 01/04/2015 1140   PROT 7.6 01/04/2015 1140   ALBUMIN 4.0 01/04/2015 1140   AST 23 01/04/2015 1140   ALT 14 01/04/2015 1140   ALKPHOS 88 01/04/2015 1140   BILITOT 0.7 01/04/2015 1140   GFRNONAA 85* 01/04/2015 1140   GFRAA >90 01/04/2015 1140   No results found for: CHOL, HDL, LDLCALC, LDLDIRECT, TRIG, CHOLHDL No results found for: [id number] No results found for: VITAMINB12 No results found for: TSH   11/10/14 CT HEAD - negative  11/25/14 EEG - normal    ASSESSMENT AND PLAN  29 y.o. year old male here with multiple breakthrough seizures in Feb, April, June, July and Oct 2016.  Dx:  Partial symptomatic epilepsy with complex partial seizures, not intractable, without status epilepticus (HCC)  Down syndrome    PLAN: - increase LEV to 1500mg  BID (liquid suspension) - seizure precautions reviewed  Meds ordered this encounter  Medications  . levETIRAcetam (KEPPRA) 100 MG/ML solution    Sig: Take 15 mLs (1,500 mg total) by mouth 2 (two) times daily.    Dispense:  1800 mL    Refill:  6    **Patient requests 90 days supply**   Return in about 6 months (around 03/25/2016).    Suanne MarkerVIKRAM R. PENUMALLI, MD 09/26/2015, 3:32 PM Certified in Neurology, Neurophysiology and Neuroimaging  [facility] [address], [address] [place], [place] [address] [phone]

## 2015-09-26 NOTE — Patient Instructions (Signed)
Increase levetiracetam to 1500mg  twice a day.

## 2016-03-19 ENCOUNTER — Ambulatory Visit (INDEPENDENT_AMBULATORY_CARE_PROVIDER_SITE_OTHER): Payer: Medicaid Other | Admitting: Diagnostic Neuroimaging

## 2016-03-19 ENCOUNTER — Encounter: Payer: Self-pay | Admitting: Diagnostic Neuroimaging

## 2016-03-19 VITALS — BP 109/76 | HR 81 | Wt 103.6 lb

## 2016-03-19 DIAGNOSIS — G40209 Localization-related (focal) (partial) symptomatic epilepsy and epileptic syndromes with complex partial seizures, not intractable, without status epilepticus: Secondary | ICD-10-CM

## 2016-03-19 DIAGNOSIS — Q909 Down syndrome, unspecified: Secondary | ICD-10-CM

## 2016-03-19 MED ORDER — LEVETIRACETAM 100 MG/ML PO SOLN: 1500.0000 mg | Freq: Two times a day (BID) | ORAL | Status: DC

## 2016-03-19 MED ORDER — DIVALPROEX SODIUM 125 MG PO CSDR: 250.0000 mg | DELAYED_RELEASE_CAPSULE | Freq: Two times a day (BID) | ORAL | Status: DC

## 2016-03-19 NOTE — Progress Notes (Signed)
[facility]  PATIENT: Isaac Murphy DOB: 09/17/1986  REFERRING CLINICIAN:  HISTORY FROM: patient, mother  REASON FOR VISIT:    HISTORICAL  CHIEF COMPLAINT:  Chief Complaint  Patient presents with  . Seizures    rm 9, [place]- caregiver, " 4 seizures since last OV; last 2 only 30 seconds long, sz in Feb during sleep"  . Follow-up    6 month    HISTORY OF PRESENT ILLNESS:   UPDATE 03/19/16: Since last visit, was doing well until Feb 2017, then had 4 seizures since that time. No triggering factors. Sz were staring then convulsive.  UPDATE 09/26/15: Since last visit, was doing well until 09/17/15 (breakthrough sz). Tolerating incr LEV  BID dose.   UPDATE 06/23/15: Since last visit, was doing well until Feb 2016 (had 2nd seizure). Then went to ER and started on LEV  BID. Then had addl sz in April, June and July 2016. Some warning, as patient would go to sofa and brace himself, then mouth and face twitching, then generalized convulsions, gasping for breath, then stopped. Confusion for 1-2 hours, then returns to baseline. Tolerating LEV.   PRIOR HPI (11/16/14): 30 year old male with history of Down syndrome, congenital solitary kidney, here for evaluation of seizure. No right or history of seizures until 11/10/14, patient was eating breakfast when all of a sudden he made gurgling sounds, had convulsions, fell out of his chair. Seizure lasted for 5 minutes. Patient did have tongue biting with the seizure. He was confused, dazed, for another 15-20 minutes. Patient was gradually returned to baseline by the time he was loaded into the ambulance and came to the emergency room. No recent sleep changes, infections, stresses, traumas.    REVIEW OF SYSTEMS: Full 14 system review of systems performed and negative except: only as per history of present illness.  ALLERGIES: No Known Allergies  HOME MEDICATIONS: Outpatient Prescriptions Prior to Visit  Medication  Sig Dispense Refill  . levETIRAcetam (KEPPRA) 100 MG/ML solution Take 15 mLs (1,500 mg total) by mouth 2 (two) times daily. 1800 mL 6   No facility-administered medications prior to visit.    PAST MEDICAL HISTORY: Past Medical History  Diagnosis Date  . Down syndrome   . H/O unilateral nephrectomy   . Seizures (HCC) 12/2014    last sz 03/18/16    PAST SURGICAL HISTORY: Past Surgical History  Procedure Laterality Date  . Abdominal surgery      FAMILY HISTORY: Family History  Problem Relation Age of Onset  . Diabetes Mother     SOCIAL HISTORY:  Social History   Social History  . Marital Status: Single    Spouse Name: N/A  . Number of Children: 0  . Years of Education: HS   Occupational History  .  Other    N/A   Social History Main Topics  . Smoking status: Never Smoker   . Smokeless tobacco: Never Used  . Alcohol Use: No  . Drug Use: No  . Sexual Activity: Not on file   Other Topics Concern  . Not on file   Social History Narrative   Patient lives at home with his family.   Caffeine Use: none     PHYSICAL EXAM  Filed Vitals:   03/19/16 1542  BP: 109/76  Pulse: 81  Weight: 103 lb 9.6 oz (46.993 kg)    Body mass index is 19.59 kg/(m^2).  No exam data present  No flowsheet data found.  GENERAL EXAM: Patient is in  no distress; well developed, nourished and groomed; neck is supple; DOWN FACIES; PLEASANT, SMILING, HYPERKINETIC  CARDIOVASCULAR: Regular rate and rhythm, no murmurs, no carotid bruits  NEUROLOGIC: MENTAL STATUS: awake, alert; SMILING, RESPONDS IN SHORT SENTENCES, PHRASES. FOLLOWS SIMPLE COMMANDS. CRANIAL NERVE: pupils equal and reactive to light, visual fields full to confrontation, extraocular muscles intact, no nystagmus, facial sensation and strength symmetric, hearing intact, palate elevates symmetrically, uvula midline, shoulder shrug symmetric, tongue midline. MOTOR: normal bulk and tone, full strength in the BUE,  BLE SENSORY: normal and symmetric to light touch, vibration and temperature COORDINATION: finger-nose-finger, fine finger movements normal REFLEXES: deep tendon reflexes TRACE and symmetric GAIT/STATION: narrow based gait; romberg is negative    DIAGNOSTIC DATA (LABS, IMAGING, TESTING) - I reviewed patient records, labs, notes, testing and imaging myself where available.  Lab Results  Component Value Date   WBC 5.2 01/04/2015   HGB 13.4 01/04/2015   HCT 39.9 01/04/2015   MCV 84.5 01/04/2015   PLT 326 01/04/2015      Component Value Date/Time   NA 137 01/04/2015 1140   K 3.5 01/04/2015 1140   CL 103 01/04/2015 1140   CO2 29 01/04/2015 1140   GLUCOSE 101* 01/04/2015 1140   BUN 11 01/04/2015 1140   CREATININE 1.17 01/04/2015 1140   CALCIUM 9.4 01/04/2015 1140   PROT 7.6 01/04/2015 1140   ALBUMIN 4.0 01/04/2015 1140   AST 23 01/04/2015 1140   ALT 14 01/04/2015 1140   ALKPHOS 88 01/04/2015 1140   BILITOT 0.7 01/04/2015 1140   GFRNONAA 85* 01/04/2015 1140   GFRAA >90 01/04/2015 1140   No results found for: CHOL, HDL, LDLCALC, LDLDIRECT, TRIG, CHOLHDL No results found for: [id number] No results found for: VITAMINB12 No results found for: TSH   11/10/14 CT HEAD - negative  11/25/14 EEG - normal    ASSESSMENT AND PLAN  30 y.o. year old male here with multiple breakthrough seizures in Feb, April, June, July and Oct 2016, and Feb-Apr 2017.   Dx:  Partial symptomatic epilepsy with complex partial seizures, not intractable, without status epilepticus (HCC)  Down syndrome    PLAN: - continue LEV 1500mg  BID (liquid suspension) - add divalproex sprinkles 250mg  BID - seizure precautions reviewed  Meds ordered this encounter  Medications  . divalproex (DEPAKOTE SPRINKLE) 125 MG capsule    Sig: Take 2 capsules (250 mg total) by mouth 2 (two) times daily.    Dispense:  120 capsule    Refill:  12    Dispense sprinkles formulation; to mix with applesauce  . levETIRAcetam  (KEPPRA) 100 MG/ML solution    Sig: Take 15 mLs (1,500 mg total) by mouth 2 (two) times daily.    Dispense:  1800 mL    Refill:  6    **Patient requests 90 days supply**   Return in about 4 months (around 07/20/2016).    Suanne MarkerVIKRAM R. Cyrstal Leitz, MD 03/19/2016, 4:03 PM Certified in Neurology, Neurophysiology and Neuroimaging  [facility] [address], [address] [place], [place] [address] [phone]

## 2016-03-19 NOTE — Patient Instructions (Signed)
-   continue levetiracetam 1500mg  twice a day (liquid suspension) - start divalproex sprinkles 250mg  twice a day

## 2016-03-26 ENCOUNTER — Ambulatory Visit: Payer: Medicaid Other | Admitting: Diagnostic Neuroimaging

## 2016-07-24 ENCOUNTER — Ambulatory Visit: Payer: Medicaid Other | Admitting: Diagnostic Neuroimaging

## 2016-07-25 ENCOUNTER — Encounter: Payer: Self-pay | Admitting: Diagnostic Neuroimaging

## 2016-08-07 ENCOUNTER — Ambulatory Visit (INDEPENDENT_AMBULATORY_CARE_PROVIDER_SITE_OTHER): Payer: Medicaid Other | Admitting: Diagnostic Neuroimaging

## 2016-08-07 ENCOUNTER — Other Ambulatory Visit: Payer: Self-pay | Admitting: Diagnostic Neuroimaging

## 2016-08-07 ENCOUNTER — Encounter: Payer: Self-pay | Admitting: Diagnostic Neuroimaging

## 2016-08-07 VITALS — BP 106/75 | HR 66 | Wt 112.8 lb

## 2016-08-07 DIAGNOSIS — Q909 Down syndrome, unspecified: Secondary | ICD-10-CM

## 2016-08-07 DIAGNOSIS — G40209 Localization-related (focal) (partial) symptomatic epilepsy and epileptic syndromes with complex partial seizures, not intractable, without status epilepticus: Secondary | ICD-10-CM | POA: Diagnosis not present

## 2016-08-07 MED ORDER — LEVETIRACETAM 100 MG/ML PO SOLN: 1500.0000 mg | Freq: Two times a day (BID) | ORAL | 6 refills | Status: DC

## 2016-08-07 MED ORDER — DIVALPROEX SODIUM 125 MG PO CSDR: 250.0000 mg | DELAYED_RELEASE_CAPSULE | Freq: Two times a day (BID) | ORAL | 12 refills | Status: DC

## 2016-08-07 NOTE — Progress Notes (Signed)
[facility]  PATIENT: Isaac Murphy Isaac Murphy DOB: 06/11/1986  REFERRING CLINICIAN:  HISTORY FROM: patient, mother  REASON FOR VISIT: follow up   HISTORICAL  CHIEF COMPLAINT:  Chief Complaint  Patient presents with  . Seizures    rm 6, mom - Sonja, "no seizure activity"  . Follow-up    HISTORY OF PRESENT ILLNESS:   UPDATE 08/07/16: Since last visit, doing well. No more seizures since starting depakote sprinkles.   UPDATE 03/19/16: Since last visit, was doing well until Feb 2017, then had 4 seizures since that time. No triggering factors. Sz were staring then convulsive.  UPDATE 09/26/15: Since last visit, was doing well until 09/17/15 (breakthrough sz). Tolerating incr LEV 1000mg  BID dose.   UPDATE 06/23/15: Since last visit, was doing well until Feb 2016 (had 2nd seizure). Then went to ER and started on LEV 500mg  BID. Then had addl sz in April, June and July 2016. Some warning, as patient would go to sofa and brace himself, then mouth and face twitching, then generalized convulsions, gasping for breath, then stopped. Confusion for 1-2 hours, then returns to baseline. Tolerating LEV.   PRIOR HPI (11/16/14): 30 year old male with history of Down syndrome, congenital solitary kidney, here for evaluation of seizure. No right or history of seizures until 11/10/14, patient was eating breakfast when all of a sudden he made gurgling sounds, had convulsions, fell out of his chair. Seizure lasted for 5 minutes. Patient did have tongue biting with the seizure. He was confused, dazed, for another 15-20 minutes. Patient was gradually returned to baseline by the time he was loaded into the ambulance and came to the emergency room. No recent sleep changes, infections, stresses, traumas.    REVIEW OF SYSTEMS: Full 14 system review of systems performed and negative except: only as per history of present illness.  ALLERGIES: No Known Allergies  HOME MEDICATIONS: Outpatient  Medications Prior to Visit  Medication Sig Dispense Refill  . divalproex (DEPAKOTE SPRINKLE) 125 MG capsule Take 2 capsules (250 mg total) by mouth 2 (two) times daily. 120 capsule 12  . levETIRAcetam (KEPPRA) 100 MG/ML solution Take 15 mLs (1,500 mg total) by mouth 2 (two) times daily. 1800 mL 6   No facility-administered medications prior to visit.     PAST MEDICAL HISTORY: Past Medical History:  Diagnosis Date  . Down syndrome   . H/O unilateral nephrectomy   . Seizures (HCC) 12/2014   last sz 03/18/16    PAST SURGICAL HISTORY: Past Surgical History:  Procedure Laterality Date  . ABDOMINAL SURGERY      FAMILY HISTORY: Family History  Problem Relation Age of Onset  . Diabetes Mother     SOCIAL HISTORY:  Social History   Social History  . Marital status: Single    Spouse name: N/A  . Number of children: 0  . Years of education: HS   Occupational History  .  Other    N/A   Social History Main Topics  . Smoking status: Never Smoker  . Smokeless tobacco: Never Used  . Alcohol use No  . Drug use: No  . Sexual activity: Not on file   Other Topics Concern  . Not on file   Social History Narrative   Patient lives at home with his family.   Caffeine Use: none     PHYSICAL EXAM  Vitals:   08/07/16 1510  BP: 106/75  Pulse: 66  Weight: 112 lb 12.8 oz (51.2 kg)    Body mass  index is 21.31 kg/m.  No exam data present  No flowsheet data found.  GENERAL EXAM: Patient is in no distress; well developed, nourished and groomed; neck is supple; DOWN FACIES; PLEASANT, SMILING, HYPERKINETIC  CARDIOVASCULAR: Regular rate and rhythm, no murmurs, no carotid bruits  NEUROLOGIC: MENTAL STATUS: awake, alert; SMILING, RESPONDS IN SHORT SENTENCES, PHRASES. FOLLOWS SIMPLE COMMANDS. CRANIAL NERVE: pupils equal and reactive to light, visual fields full to confrontation, extraocular muscles intact, no nystagmus, facial sensation and strength symmetric, hearing intact,  palate elevates symmetrically, uvula midline, shoulder shrug symmetric, tongue midline. MOTOR: normal bulk and tone, full strength in the BUE, BLE SENSORY: normal and symmetric to light touch, vibration and temperature COORDINATION: finger-nose-finger, fine finger movements normal REFLEXES: deep tendon reflexes TRACE and symmetric GAIT/STATION: narrow based gait; romberg is negative    DIAGNOSTIC DATA (LABS, IMAGING, TESTING) - I reviewed patient records, labs, notes, testing and imaging myself where available.  Lab Results  Component Value Date   WBC 5.2 01/04/2015   HGB 13.4 01/04/2015   HCT 39.9 01/04/2015   MCV 84.5 01/04/2015   PLT 326 01/04/2015      Component Value Date/Time   NA 137 01/04/2015 1140   K 3.5 01/04/2015 1140   CL 103 01/04/2015 1140   CO2 29 01/04/2015 1140   GLUCOSE 101 (H) 01/04/2015 1140   BUN 11 01/04/2015 1140   CREATININE 1.17 01/04/2015 1140   CALCIUM 9.4 01/04/2015 1140   PROT 7.6 01/04/2015 1140   ALBUMIN 4.0 01/04/2015 1140   AST 23 01/04/2015 1140   ALT 14 01/04/2015 1140   ALKPHOS 88 01/04/2015 1140   BILITOT 0.7 01/04/2015 1140   GFRNONAA 85 (L) 01/04/2015 1140   GFRAA >90 01/04/2015 1140   No results found for: CHOL, HDL, LDLCALC, LDLDIRECT, TRIG, CHOLHDL No results found for: UJWJ1B No results found for: VITAMINB12 No results found for: TSH   11/10/14 CT HEAD - negative  11/25/14 EEG - normal    ASSESSMENT AND PLAN  30 y.o. year old male here with multiple breakthrough seizures in Feb, April, June, July and Oct 2016, and Feb-Apr 2017.    Dx:  Partial symptomatic epilepsy with complex partial seizures, not intractable, without status epilepticus (HCC)  Down syndrome    PLAN: - continue LEV 1500mg  BID (liquid suspension) - continue divalproex sprinkles 250mg  BID - seizure precautions reviewed - check CBC, CMP annually (will be challenging due to patient's cognitive limitations and hyperkinetic movements; may need  sedation)  Meds ordered this encounter  Medications  . divalproex (DEPAKOTE SPRINKLE) 125 MG capsule    Sig: Take 2 capsules (250 mg total) by mouth 2 (two) times daily.    Dispense:  120 capsule    Refill:  12    Dispense sprinkles formulation; to mix with applesauce  . levETIRAcetam (KEPPRA) 100 MG/ML solution    Sig: Take 15 mLs (1,500 mg total) by mouth 2 (two) times daily.    Dispense:  1800 mL    Refill:  6    **Patient requests 90 days supply**   Return in about 6 months (around 02/04/2017).    Suanne Marker, MD 08/07/2016, 3:25 PM Certified in Neurology, Neurophysiology and Neuroimaging  [facility] [address], [address] [place], [place] [address] [phone]

## 2017-02-04 ENCOUNTER — Ambulatory Visit (INDEPENDENT_AMBULATORY_CARE_PROVIDER_SITE_OTHER): Payer: Medicaid Other | Admitting: Diagnostic Neuroimaging

## 2017-02-04 ENCOUNTER — Encounter (INDEPENDENT_AMBULATORY_CARE_PROVIDER_SITE_OTHER): Payer: Self-pay

## 2017-02-20 ENCOUNTER — Ambulatory Visit (INDEPENDENT_AMBULATORY_CARE_PROVIDER_SITE_OTHER): Payer: Medicaid Other | Admitting: Diagnostic Neuroimaging

## 2017-02-20 ENCOUNTER — Encounter: Payer: Self-pay | Admitting: Diagnostic Neuroimaging

## 2017-02-20 VITALS — BP 121/87 | HR 78 | Ht 61.0 in | Wt 124.0 lb

## 2017-02-20 DIAGNOSIS — Q909 Down syndrome, unspecified: Secondary | ICD-10-CM | POA: Diagnosis not present

## 2017-02-20 DIAGNOSIS — G40209 Localization-related (focal) (partial) symptomatic epilepsy and epileptic syndromes with complex partial seizures, not intractable, without status epilepticus: Secondary | ICD-10-CM

## 2017-02-20 MED ORDER — DIVALPROEX SODIUM 125 MG PO CSDR: 250.0000 mg | DELAYED_RELEASE_CAPSULE | Freq: Two times a day (BID) | ORAL | 4 refills | Status: DC

## 2017-02-20 MED ORDER — LEVETIRACETAM 100 MG/ML PO SOLN: 1500.0000 mg | Freq: Two times a day (BID) | ORAL | 6 refills | Status: DC

## 2017-02-20 NOTE — Progress Notes (Signed)
[facility]  PATIENT: Isaac Murphy DOB: 29-Jul-1986  REFERRING CLINICIAN:  HISTORY FROM: patient, mother  REASON FOR VISIT: follow up   HISTORICAL  CHIEF COMPLAINT:  Chief Complaint  Patient presents with  . Follow-up    Rm 6  . Seizures    had a nocturnal sz back in february (unknown trigger).     HISTORY OF PRESENT ILLNESS:   UPDATE 02/20/17: Since last visit, had 1 seizure in Feb 2018; mother saw patient him after seizure and patient breathing heavy and seemed more confusion. No triggering factors.   UPDATE 08/07/16: Since last visit, doing well. No more seizures since starting depakote sprinkles.   UPDATE 03/19/16: Since last visit, was doing well until Feb 2017, then had 4 seizures since that time. No triggering factors. Sz were staring then convulsive.  UPDATE 09/26/15: Since last visit, was doing well until 09/17/15 (breakthrough sz). Tolerating incr LEV  BID dose.   UPDATE 06/23/15: Since last visit, was doing well until Feb 2016 (had 2nd seizure). Then went to ER and started on LEV  BID. Then had addl sz in April, June and July 2016. Some warning, as patient would go to sofa and brace himself, then mouth and face twitching, then generalized convulsions, gasping for breath, then stopped. Confusion for 1-2 hours, then returns to baseline. Tolerating LEV.   PRIOR HPI (11/16/14): 31 year old male with history of Down syndrome, congenital solitary kidney, here for evaluation of seizure. No right or history of seizures until 11/10/14, patient was eating breakfast when all of a sudden he made gurgling sounds, had convulsions, fell out of his chair. Seizure lasted for 5 minutes. Patient did have tongue biting with the seizure. He was confused, dazed, for another 15-20 minutes. Patient was gradually returned to baseline by the time he was loaded into the ambulance and came to the emergency room. No recent sleep changes, infections, stresses, traumas.     REVIEW OF SYSTEMS: Full 14 system review of systems performed and negative except: only as per history of present illness.  ALLERGIES: No Known Allergies  HOME MEDICATIONS: Outpatient Medications Prior to Visit  Medication Sig Dispense Refill  . divalproex (DEPAKOTE SPRINKLE) 125 MG capsule Take 2 capsules (250 mg total) by mouth 2 (two) times daily. 120 capsule 12  . levETIRAcetam (KEPPRA) 100 MG/ML solution TAKE 15 MLS BY MOUTH TWICE DAILY 2700 mL 6   No facility-administered medications prior to visit.     PAST MEDICAL HISTORY: Past Medical History:  Diagnosis Date  . Down syndrome   . H/O unilateral nephrectomy   . Seizures (HCC) 12/2014   last sz 03/18/16    PAST SURGICAL HISTORY: Past Surgical History:  Procedure Laterality Date  . ABDOMINAL SURGERY      FAMILY HISTORY: Family History  Problem Relation Age of Onset  . Diabetes Mother     SOCIAL HISTORY:  Social History   Social History  . Marital status: Single    Spouse name: N/A  . Number of children: 0  . Years of education: HS   Occupational History  .  Other    N/A   Social History Main Topics  . Smoking status: Never Smoker  . Smokeless tobacco: Never Used  . Alcohol use No  . Drug use: No  . Sexual activity: Not on file   Other Topics Concern  . Not on file   Social History Narrative   Patient lives at home with his family.   Caffeine Use:  none     PHYSICAL EXAM  Vitals:   02/20/17 1008  BP: 121/87  Pulse: 78  Weight: 124 lb (56.2 kg)  Height:  (1.549 m)    Body mass index is 23.43 kg/m.  No exam data present  No flowsheet data found.  GENERAL EXAM: Patient is in no distress; well developed, nourished and groomed; neck is supple; DOWN FACIES; PLEASANT, SMILING, HYPERKINETIC  CARDIOVASCULAR: Regular rate and rhythm, no murmurs, no carotid bruits  NEUROLOGIC: MENTAL STATUS: awake, alert; SMILING, RESPONDS IN SHORT SENTENCES, PHRASES. FOLLOWS SIMPLE  COMMANDS. CRANIAL NERVE: pupils equal and reactive to light, visual fields full to confrontation, extraocular muscles intact, no nystagmus, facial sensation and strength symmetric, hearing intact, palate elevates symmetrically, uvula midline, shoulder shrug symmetric, tongue midline. MOTOR: normal bulk and tone, full strength in the BUE, BLE SENSORY: normal and symmetric to light touch, vibration and temperature COORDINATION: finger-nose-finger, fine finger movements normal REFLEXES: deep tendon reflexes TRACE and symmetric GAIT/STATION: narrow based gait; romberg is negative    DIAGNOSTIC DATA (LABS, IMAGING, TESTING) - I reviewed patient records, labs, notes, testing and imaging myself where available.  Lab Results  Component Value Date   WBC 5.2 01/04/2015   HGB 13.4 01/04/2015   HCT 39.9 01/04/2015   MCV 84.5 01/04/2015   PLT 326 01/04/2015      Component Value Date/Time   NA 137 01/04/2015 1140   K 3.5 01/04/2015 1140   CL 103 01/04/2015 1140   CO2 29 01/04/2015 1140   GLUCOSE 101 (H) 01/04/2015 1140   BUN 11 01/04/2015 1140   CREATININE 1.17 01/04/2015 1140   CALCIUM 9.4 01/04/2015 1140   PROT 7.6 01/04/2015 1140   ALBUMIN 4.0 01/04/2015 1140   AST 23 01/04/2015 1140   ALT 14 01/04/2015 1140   ALKPHOS 88 01/04/2015 1140   BILITOT 0.7 01/04/2015 1140   GFRNONAA 85 (L) 01/04/2015 1140   GFRAA >90 01/04/2015 1140   No results found for: CHOL, HDL, LDLCALC, LDLDIRECT, TRIG, CHOLHDL No results found for: ZOXW9U No results found for: VITAMINB12 No results found for: TSH   11/10/14 CT HEAD - negative  11/25/14 EEG - normal    ASSESSMENT AND PLAN  31 y.o. year old male here with multiple breakthrough seizures in Feb, April, June, July and Oct 2016, Feb-Apr 2017, Feb 2018.    Dx:  Partial symptomatic epilepsy with complex partial seizures, not intractable, without status epilepticus (HCC)  Down syndrome     PLAN: I spent 15 minutes of face to face time  with patient. Greater than 50% of time was spent in counseling and coordination of care with patient. In summary we discussed:  - continue LEV  BID (liquid suspension) - continue divalproex sprinkles  BID; could consider slight increase, but I am concerned about inability to do routine lab monitoring; for now, will keep the same dose - seizure precautions reviewed - check CBC, CMP annually (will be challenging due to patient's cognitive limitations and hyperkinetic movements; may need sedation)  Meds ordered this encounter  Medications  . levETIRAcetam (KEPPRA) 100 MG/ML solution    Sig: Take 15 mLs (1,500 mg total) by mouth 2 (two) times daily.    Dispense:  2700 mL    Refill:  6    **Patient requests 90 days supply**  . divalproex (DEPAKOTE SPRINKLE) 125 MG capsule    Sig: Take 2 capsules (250 mg total) by mouth 2 (two) times daily.    Dispense:  360 capsule  Refill:  4    Dispense sprinkles formulation; to mix with applesauce   Return in about 1 year (around 02/20/2018) for with Butch Penny or Penumalli.    Suanne Marker, MD 02/20/2017, 12:02 PM Certified in Neurology, Neurophysiology and Neuroimaging  [facility] [address] [place], [place] [address] [phone]

## 2017-04-08 ENCOUNTER — Other Ambulatory Visit: Payer: Self-pay | Admitting: Diagnostic Neuroimaging

## 2017-08-29 ENCOUNTER — Other Ambulatory Visit: Payer: Self-pay | Admitting: Diagnostic Neuroimaging

## 2017-09-09 ENCOUNTER — Telehealth: Payer: Self-pay | Admitting: Adult Health

## 2017-09-09 NOTE — Telephone Encounter (Signed)
Pt's mother called he had seizure 9/29 and again yesterday-10/21. She requested and appt for possible medication adjustment. Pt has appt with Megan 10.24.18

## 2017-09-11 ENCOUNTER — Encounter: Payer: Self-pay | Admitting: Adult Health

## 2017-09-11 ENCOUNTER — Ambulatory Visit (INDEPENDENT_AMBULATORY_CARE_PROVIDER_SITE_OTHER): Payer: Medicaid Other | Admitting: Adult Health

## 2017-09-11 VITALS — BP 131/88 | HR 85 | Ht 61.0 in | Wt 141.0 lb

## 2017-09-11 DIAGNOSIS — R569 Unspecified convulsions: Secondary | ICD-10-CM

## 2017-09-11 DIAGNOSIS — Z5181 Encounter for therapeutic drug level monitoring: Secondary | ICD-10-CM

## 2017-09-11 DIAGNOSIS — Q909 Down syndrome, unspecified: Secondary | ICD-10-CM | POA: Diagnosis not present

## 2017-09-11 MED ORDER — DIVALPROEX SODIUM 125 MG PO CSDR: 375.0000 mg | DELAYED_RELEASE_CAPSULE | Freq: Two times a day (BID) | ORAL | 4 refills | Status: DC

## 2017-09-11 NOTE — Progress Notes (Signed)
PATIENT: Isaac Murphy DOB: 07/10/1986  REASON FOR VISIT: follow up- seizures HISTORY FROM: patient  HISTORY OF PRESENT ILLNESS: Today 09/11/17 Mr. Rosann AuerbachCrawley is a 31 year old male with a history of seizure events. He returns today for an evaluation. His mom reports that he had a seizure on Saturday. Reports that it was a generalized seizure. He had convulsing in the extremities with loss of consciousness. She states that his tongue was also purple. The seizure lasted approximately 3 minutes. She is unsure if he lost bowels or  bladder as he wears depends regularly. She denies missing any doses. She does state that the power was out Thursday and Friday. Therefore the patient did not sleep much because of this. He returns today for an evaluation.   UPDATE 02/20/17: Since last visit, had 1 seizure in Feb 2018; mother saw patient him after seizure and patient breathing heavy and seemed more confusion. No triggering factors.   UPDATE 08/07/16: Since last visit, doing well. No more seizures since starting depakote sprinkles.   UPDATE 03/19/16: Since last visit, was doing well until Feb 2017, then had 4 seizures since that time. No triggering factors. Sz were staring then convulsive.  UPDATE 09/26/15: Since last visit, was doing well until 09/17/15 (breakthrough sz). Tolerating incr LEV 1000mg  BID dose.   UPDATE 06/23/15: Since last visit, was doing well until Feb 2016 (had 2nd seizure). Then went to ER and started on LEV 500mg  BID. Then had addl sz in April, June and July 2016. Some warning, as patient would go to sofa and brace himself, then mouth and face twitching, then generalized convulsions, gasping for breath, then stopped. Confusion for 1-2 hours, then returns to baseline. Tolerating LEV.   PRIOR HPI (11/16/14): 31 year old male with history of Down syndrome, congenital solitary kidney, here for evaluation of seizure. No right or history of seizures until 11/10/14, patient was eating  breakfast when all of a sudden he made gurgling sounds, had convulsions, fell out of his chair. Seizure lasted for 5 minutes. Patient did have tongue biting with the seizure. He was confused, dazed, for another 15-20 minutes. Patient was gradually returned to baseline by the time he was loaded into the ambulance and came to the emergency room. No recent sleep changes, infections, stresses, traumas.     REVIEW OF SYSTEMS: Out of a complete 14 system review of symptoms, the patient complains only of the following symptoms, and all other reviewed systems are negative.  See HPI  ALLERGIES: No Known Allergies  HOME MEDICATIONS: Outpatient Medications Prior to Visit  Medication Sig Dispense Refill  . divalproex (DEPAKOTE SPRINKLE) 125 MG capsule Take 2 capsules (250 mg total) by mouth 2 (two) times daily. 360 capsule 4  . levETIRAcetam (KEPPRA) 100 MG/ML solution TAKE 15 MLS BY MOUTH TWICE DAILY 2700 mL 6   No facility-administered medications prior to visit.     PAST MEDICAL HISTORY: Past Medical History:  Diagnosis Date  . Down syndrome   . H/O unilateral nephrectomy   . Seizures (HCC) 12/2014   last sz 03/18/16    PAST SURGICAL HISTORY: Past Surgical History:  Procedure Laterality Date  . ABDOMINAL SURGERY      FAMILY HISTORY: Family History  Problem Relation Age of Onset  . Diabetes Mother     SOCIAL HISTORY: Social History   Social History  . Marital status: Single    Spouse name: N/A  . Number of children: 0  . Years of education: HS   Occupational  History  .  Other    N/A   Social History Main Topics  . Smoking status: Never Smoker  . Smokeless tobacco: Never Used  . Alcohol use No  . Drug use: No  . Sexual activity: Not on file   Other Topics Concern  . Not on file   Social History Narrative   Patient lives at home with his family.   Caffeine Use: none      PHYSICAL EXAM  Vitals:   09/11/17 1342  BP: 131/88  Pulse: 85  Weight: 141 lb (64  kg)  Height: 5\' 1"  (1.549 m)   Body mass index is 26.64 kg/m.  Generalized: Well developed, in no acute distress   Neurological examination  Mentation: Alert. Follows simple commands intermittently.  Cranial nerve II-XII: Pupils were equal round reactive to light. Extraocular movements were full, visual field were full on confrontational test. Facial sensation and strength were normal. Uvula tongue midline. Head turning and shoulder shrug  were normal and symmetric. Motor: Good strength throughout Sensory: Sensory testing is intact to soft touch on all 4 extremities. No evidence of extinction is noted.  Coordination: unable to test Gait and station: Gait is normal.  Reflexes: Deep tendon reflexes are symmetric and normal bilaterally.   DIAGNOSTIC DATA (LABS, IMAGING, TESTING) - I reviewed patient records, labs, notes, testing and imaging myself where available.  Lab Results  Component Value Date   WBC 5.2 01/04/2015   HGB 13.4 01/04/2015   HCT 39.9 01/04/2015   MCV 84.5 01/04/2015   PLT 326 01/04/2015      Component Value Date/Time   NA 137 01/04/2015 1140   K 3.5 01/04/2015 1140   CL 103 01/04/2015 1140   CO2 29 01/04/2015 1140   GLUCOSE 101 (H) 01/04/2015 1140   BUN 11 01/04/2015 1140   CREATININE 1.17 01/04/2015 1140   CALCIUM 9.4 01/04/2015 1140   PROT 7.6 01/04/2015 1140   ALBUMIN 4.0 01/04/2015 1140   AST 23 01/04/2015 1140   ALT 14 01/04/2015 1140   ALKPHOS 88 01/04/2015 1140   BILITOT 0.7 01/04/2015 1140   GFRNONAA 85 (L) 01/04/2015 1140   GFRAA >90 01/04/2015 1140      ASSESSMENT AND PLAN 31 y.o. year old male  has a past medical history of Down syndrome; H/O unilateral nephrectomy; and Seizures (HCC) (12/2014). here with:  1. Seizures  Patient will remain on Keppra 1500 mg twice a day. We will increase Depakote slightly- 3 tablets (375 mg ) twice a day. We attempted to check blood work today however the patient would not cooperate. His mother states  that primary care was able to check blood work. I did advise that if they check blood work again please ask them to check a Depakote level. She verbalized understanding. If the patient has any additional seizure events she should let us know. He will follow-up in 3 months or sooner if needed.  I spent 15 minutes with the patient. 50% of this time was spent discussing medication      Butch Penny, MSN, NP-C 09/11/2017, 1:49 PM [facility] [address] [place], [place] [address] [phone]

## 2017-09-11 NOTE — Patient Instructions (Signed)
Your Plan:  Continue Keppra Increase Depakote to 3 tablets twice a day  If you have any additional seizures please let us know  [facility]. I hope we have been able to provide you high quality care today.  You may receive a patient satisfaction survey over the next few weeks. We would appreciate your feedback and comments so that we may continue to improve ourselves and the health of our patients.

## 2017-09-12 ENCOUNTER — Telehealth: Payer: Self-pay | Admitting: *Deleted

## 2017-09-12 NOTE — Telephone Encounter (Signed)
Called [employer] to initiate PA for Divalproex. Spoke with Rosanne AshingJim who stated the medication is covered. He stated the plan paid [facility] yesterday for the medication; it should be ready for patient to pick up.  Call interaction Ref #  I -- [id number] Faxed paper back to [facility] with this information.

## 2017-09-29 NOTE — Progress Notes (Signed)
I reviewed note and agree with plan.   VIKRAM R. PENUMALLI, MD  Certified in Neurology, Neurophysiology and Neuroimaging  [facility] [address], [address] , [place] [address] [phone]   

## 2018-01-13 ENCOUNTER — Encounter: Payer: Self-pay | Admitting: Diagnostic Neuroimaging

## 2018-01-13 ENCOUNTER — Encounter (INDEPENDENT_AMBULATORY_CARE_PROVIDER_SITE_OTHER): Payer: Self-pay

## 2018-01-13 ENCOUNTER — Ambulatory Visit: Payer: Medicaid Other | Admitting: Diagnostic Neuroimaging

## 2018-01-13 VITALS — BP 129/88 | HR 91 | Wt 148.8 lb

## 2018-01-13 DIAGNOSIS — G40209 Localization-related (focal) (partial) symptomatic epilepsy and epileptic syndromes with complex partial seizures, not intractable, without status epilepticus: Secondary | ICD-10-CM | POA: Diagnosis not present

## 2018-01-13 DIAGNOSIS — R569 Unspecified convulsions: Secondary | ICD-10-CM

## 2018-01-13 DIAGNOSIS — Q909 Down syndrome, unspecified: Secondary | ICD-10-CM | POA: Diagnosis not present

## 2018-01-13 MED ORDER — LEVETIRACETAM 100 MG/ML PO SOLN: 1500.0000 mg | Freq: Two times a day (BID) | ORAL | 4 refills | Status: DC

## 2018-01-13 MED ORDER — DIVALPROEX SODIUM 125 MG PO CSDR: 375.0000 mg | DELAYED_RELEASE_CAPSULE | Freq: Two times a day (BID) | ORAL | 4 refills | Status: DC

## 2018-01-13 NOTE — Progress Notes (Signed)
[facility]  PATIENT: Isaac Murphy DOB: 1986-09-10  REFERRING CLINICIAN:  HISTORY FROM: patient, mother  REASON FOR VISIT: follow up   HISTORICAL  CHIEF COMPLAINT:  Chief Complaint  Patient presents with  . Epilepsy    rm 7, mother- Hiram Gash, "doing well, no seizures since Oct 2018"  . Follow-up    4 month    HISTORY OF PRESENT ILLNESS:   UPDATE (01/13/18, VRP): Since last visit, doing well. Tolerating higher dose of depakote. No alleviating or aggravating factors. Some increased weight. No seizures since Oct 2018.  UPDATE (09/11/17, MM): 32 year old male with a history of seizure events. He returns today for an evaluation. His mom reports that he had a seizure on Saturday. Reports that it was a generalized seizure. He had convulsing in the extremities with loss of consciousness. She states that his tongue was also purple. The seizure lasted approximately 3 minutes. She is unsure if he lost bowels or  bladder as he wears depends regularly. She denies missing any doses. She does state that the power was out Thursday and Friday. Therefore the patient did not sleep much because of this. He returns today for an evaluation.  UPDATE 02/20/17: Since last visit, had 1 seizure in Feb 2018; mother saw patient him after seizure and patient breathing heavy and seemed more confusion. No triggering factors.   UPDATE 08/07/16: Since last visit, doing well. No more seizures since starting depakote sprinkles.   UPDATE 03/19/16: Since last visit, was doing well until Feb 2017, then had 4 seizures since that time. No triggering factors. Sz were staring then convulsive.  UPDATE 09/26/15: Since last visit, was doing well until 09/17/15 (breakthrough sz). Tolerating incr LEV 1000mg  BID dose.   UPDATE 06/23/15: Since last visit, was doing well until Feb 2016 (had 2nd seizure). Then went to ER and started on LEV 500mg  BID. Then had addl sz in April, June and July 2016. Some warning, as  patient would go to sofa and brace himself, then mouth and face twitching, then generalized convulsions, gasping for breath, then stopped. Confusion for 1-2 hours, then returns to baseline. Tolerating LEV.   PRIOR HPI (11/16/14): 32 year old male with history of Down syndrome, congenital solitary kidney, here for evaluation of seizure. No right or history of seizures until 11/10/14, patient was eating breakfast when all of a sudden he made gurgling sounds, had convulsions, fell out of his chair. Seizure lasted for 5 minutes. Patient did have tongue biting with the seizure. He was confused, dazed, for another 15-20 minutes. Patient was gradually returned to baseline by the time he was loaded into the ambulance and came to the emergency room. No recent sleep changes, infections, stresses, traumas.    REVIEW OF SYSTEMS: Full 14 system review of systems performed and negative except: only as per HPI.   ALLERGIES: No Known Allergies  HOME MEDICATIONS: Outpatient Medications Prior to Visit  Medication Sig Dispense Refill  . divalproex (DEPAKOTE SPRINKLE) 125 MG capsule Take 3 capsules (375 mg total) by mouth 2 (two) times daily. 540 capsule 4  . levETIRAcetam (KEPPRA) 100 MG/ML solution TAKE 15 MLS BY MOUTH TWICE DAILY 2700 mL 6   No facility-administered medications prior to visit.     PAST MEDICAL HISTORY: Past Medical History:  Diagnosis Date  . Down syndrome   . H/O unilateral nephrectomy   . Seizures (HCC) 12/2014   last sz 03/18/16    PAST SURGICAL HISTORY: Past Surgical History:  Procedure Laterality Date  .  ABDOMINAL SURGERY      FAMILY HISTORY: Family History  Problem Relation Age of Onset  . Diabetes Mother     SOCIAL HISTORY:  Social History   Socioeconomic History  . Marital status: Single    Spouse name: Not on file  . Number of children: 0  . Years of education: HS  . Highest education level: Not on file  Social Needs  . Financial resource strain: Not on file   . Food insecurity - worry: Not on file  . Food insecurity - inability: Not on file  . Transportation needs - medical: Not on file  . Transportation needs - non-medical: Not on file  Occupational History    Employer: OTHER    Comment: N/A  Tobacco Use  . Smoking status: Never Smoker  . Smokeless tobacco: Never Used  Substance and Sexual Activity  . Alcohol use: No    Alcohol/week: 0.0 oz  . Drug use: No  . Sexual activity: Not on file  Other Topics Concern  . Not on file  Social History Narrative   Patient lives at home with his family.   Caffeine Use: none     PHYSICAL EXAM  Vitals:   01/13/18 1612  BP: 129/88  Pulse: 91  Weight: 148 lb 12.8 oz (67.5 kg)    Body mass index is 28.12 kg/m.  No exam data present  No flowsheet data found.  GENERAL EXAM: Patient is in no distress; well developed, nourished and groomed; neck is supple; DOWN FACIES; PLEASANT, SMILING, HYPERKINETIC  CARDIOVASCULAR: Regular rate and rhythm, no murmurs, no carotid bruits  NEUROLOGIC: MENTAL STATUS: awake, alert; SMILING, RESPONDS IN SHORT SENTENCES, PHRASES. FOLLOWS SIMPLE COMMANDS. CRANIAL NERVE: pupils equal and reactive to light, visual fields full to confrontation, extraocular muscles intact, no nystagmus, facial sensation and strength symmetric, hearing intact, palate elevates symmetrically, uvula midline, shoulder shrug symmetric, tongue midline. MOTOR: normal bulk and tone, full strength in the BUE, BLE SENSORY: normal and symmetric to light touch, vibration and temperature COORDINATION: finger-nose-finger, fine finger movements normal REFLEXES: deep tendon reflexes TRACE and symmetric GAIT/STATION: narrow based gait; romberg is negative    DIAGNOSTIC DATA (LABS, IMAGING, TESTING) - I reviewed patient records, labs, notes, testing and imaging myself where available.  Lab Results  Component Value Date   WBC 5.2 01/04/2015   HGB 13.4 01/04/2015   HCT 39.9 01/04/2015   MCV  84.5 01/04/2015   PLT 326 01/04/2015      Component Value Date/Time   NA 137 01/04/2015 1140   K 3.5 01/04/2015 1140   CL 103 01/04/2015 1140   CO2 29 01/04/2015 1140   GLUCOSE 101 (H) 01/04/2015 1140   BUN 11 01/04/2015 1140   CREATININE 1.17 01/04/2015 1140   CALCIUM 9.4 01/04/2015 1140   PROT 7.6 01/04/2015 1140   ALBUMIN 4.0 01/04/2015 1140   AST 23 01/04/2015 1140   ALT 14 01/04/2015 1140   ALKPHOS 88 01/04/2015 1140   BILITOT 0.7 01/04/2015 1140   GFRNONAA 85 (L) 01/04/2015 1140   GFRAA >90 01/04/2015 1140   No results found for: CHOL, HDL, LDLCALC, LDLDIRECT, TRIG, CHOLHDL No results found for: [id number] No results found for: VITAMINB12 No results found for: TSH   11/10/14 CT HEAD - negative  11/25/14 EEG - normal    ASSESSMENT AND PLAN  32 y.o. year old male here with multiple breakthrough seizures in Feb, April, June, July and Oct 2016, Feb-Apr 2017, Feb 2018.    Dx:  Seizures (  HCC)  Down syndrome  Partial symptomatic epilepsy with complex partial seizures, not intractable, without status epilepticus (HCC)    PLAN:  - continue LEV 1500mg  twice a day (liquid suspension) - continue divalproex sprinkles 375mg  twice a day  - seizure precautions reviewed - check CBC, CMP annually per PCP (will be challenging due to patient's cognitive limitations and hyperkinetic movements; may need sedation)  Meds ordered this encounter  Medications  . levETIRAcetam (KEPPRA) 100 MG/ML solution    Sig: Take 15 mLs (1,500 mg total) by mouth 2 (two) times daily.    Dispense:  2700 mL    Refill:  4  . divalproex (DEPAKOTE SPRINKLE) 125 MG capsule    Sig: Take 3 capsules (375 mg total) by mouth 2 (two) times daily.    Dispense:  540 capsule    Refill:  4    Dispense sprinkles formulation; to mix with applesauce   Return in about 1 year (around 01/13/2019) for with NP.    Suanne Marker, MD 01/13/2018, 4:22 PM Certified in Neurology, Neurophysiology and  Neuroimaging  [facility] [address] [place], [place] [address] [phone]

## 2018-01-14 ENCOUNTER — Telehealth: Payer: Self-pay | Admitting: Diagnostic Neuroimaging

## 2018-01-14 NOTE — Telephone Encounter (Signed)
Pt mother calling to make aware pharmacy told her a PA is needed for divalproex (DEPAKOTE SPRINKLE) 125 MG capsule.  Please call

## 2018-01-15 NOTE — Telephone Encounter (Signed)
LVM for patient's mother, [place]ja on [place] and advised her that this RN spoke with pharmacist who will call [facility] today and get medication approved. Left number for any questions.

## 2018-01-15 NOTE — Telephone Encounter (Addendum)
Called [facility] to discuss PA for depakote sprinkles and spoke with Lupita Leashonna. She stated that the medication is preferred, however the pharmacy is putting in incorrect codes. The prescription does exceed acceptable daily dosing,  however she can give pharmacy code to get over ride approval.  She stated the pharmacy needs to call [facility] to get correct code for override approval. The call interaction ID #-  [id number]  [id number]. Called [facility], [place], spoke with pharmacist and gave her above information. She stated she would call [facility] and take care of issue. She verbalized understanding , appreciation of call.

## 2018-02-26 ENCOUNTER — Ambulatory Visit: Payer: Medicaid Other | Admitting: Adult Health

## 2018-07-01 ENCOUNTER — Other Ambulatory Visit (HOSPITAL_COMMUNITY): Payer: Self-pay | Admitting: Internal Medicine

## 2018-07-01 DIAGNOSIS — I517 Cardiomegaly: Secondary | ICD-10-CM

## 2018-07-03 ENCOUNTER — Other Ambulatory Visit (HOSPITAL_COMMUNITY): Payer: Self-pay

## 2018-07-04 ENCOUNTER — Ambulatory Visit (HOSPITAL_COMMUNITY)
Admission: RE | Admit: 2018-07-04 | Discharge: 2018-07-04 | Disposition: A | Discharge status: Home / Self Care | Payer: Medicaid Other | Source: Ambulatory Visit | Attending: Internal Medicine | Admitting: Internal Medicine

## 2018-07-04 DIAGNOSIS — I517 Cardiomegaly: Secondary | ICD-10-CM | POA: Insufficient documentation

## 2018-07-04 DIAGNOSIS — Q909 Down syndrome, unspecified: Secondary | ICD-10-CM | POA: Diagnosis not present

## 2018-07-04 NOTE — Progress Notes (Signed)
  Echocardiogram 2D Echocardiogram has been performed.  Isaac Murphy, Isaac Murphy 07/04/2018, 10:01 AM

## 2019-01-13 ENCOUNTER — Telehealth: Payer: Self-pay | Admitting: Diagnostic Neuroimaging

## 2019-01-13 NOTE — Telephone Encounter (Signed)
Received call back from mother who stated the patient had a seizure Sunday night with convulsions. She reported he's haivng spells where he goes into daze and drops drink 2 x daily. He has not been sick or missed any doses of medications.  He had FU on 01/14/19  with Dolores Hoose, NP , but it was cancelled due to NP being out of the office.  I scheduled patient with  A Lomax, NP on Thurs and advised they arrive 30 minutes early to check. Mother verbalized understanding, appreciation.

## 2019-01-13 NOTE — Telephone Encounter (Signed)
Agree. May need to increase divalproex to 500mg  twice a day. -VRP

## 2019-01-13 NOTE — Telephone Encounter (Signed)
LVM for mother requesting call back to schedule FU.

## 2019-01-13 NOTE — Telephone Encounter (Signed)
Pts mother Sonya/DPR states the pt has has several seizures and would like an appt as soon as possible. Does not want to wait months for next available. Please advise.

## 2019-01-14 ENCOUNTER — Ambulatory Visit: Payer: Medicaid Other | Admitting: Adult Health

## 2019-01-14 ENCOUNTER — Other Ambulatory Visit: Payer: Self-pay | Admitting: Diagnostic Neuroimaging

## 2019-01-14 NOTE — Progress Notes (Signed)
PATIENT: Isaac Murphy DOB: [birth year]  REASON FOR VISIT: follow up HISTORY FROM: patient  Chief Complaint  Patient presents with  . Follow-up    Seizure follow up. Mother present. New room. Patient's mother mentioned that his recent seizure was Sunday night.      HISTORY OF PRESENT ILLNESS: Today 01/15/19 Isaac Murphy is a 33 y.o. male here today for follow up for seizures. He has had several seizures since last being seen. He had a convulsive seizure in September and November 2019. Another on 01/10/19. She reported he's having spells where "he goes into a daze and drops things." He has had similar events in the past but she has always been able to talk him through it. She has noticed this is happening about 1-2 times a day. Event lasts just a couple of seconds. He has not been sick or missed any doses of medications. He is currently taking levetiracetam 1500mg  BID and divalproex 375mg  BID. Mom reports that he recently had labs drawn with PCP but is uncertain of results.    HISTORY: (copied from Dr Richrd Humbles note on 01/13/2018) UPDATE (01/13/18, VRP): Since last visit, doing well. Tolerating higher dose of depakote. No alleviating or aggravating factors. Some increased weight. No seizures since Oct 2018.  UPDATE (09/11/17, MM): 33 year old male with a history of seizure events. He returns today for an evaluation. His mom reports that he had a seizure on Saturday. Reports that it was a generalized seizure. He had convulsing in the extremities with loss of consciousness. She states that histongue was also purple. The seizure lasted approximately 3 minutes. She is unsure if he lostbowels orbladder as he wears depends regularly. She denies missing any doses. She does state that the power was outThursday and Friday. Thereforethe patient did not sleep much because of this. He returns today for an evaluation.  UPDATE 02/20/17: Since last visit, had 1 seizure in Feb 2018;  mother saw patient him after seizure and patient breathing heavy and seemed more confusion. No triggering factors.   UPDATE 08/07/16: Since last visit, doing well. No more seizures since starting depakote sprinkles.   UPDATE 03/19/16: Since last visit, was doing well until Feb 2017, then had 4 seizures since that time. No triggering factors. Sz were staring then convulsive.  UPDATE 09/26/15: Since last visit, was doing well until 09/17/15 (breakthrough sz). Tolerating incr LEV 1000mg  BID dose.   UPDATE 06/23/15: Since last visit, was doing well until Feb 2016 (had 2nd seizure). Then went to ER and started on LEV 500mg  BID. Then had addl sz in April, June and July 2016. Some warning, as patient would go to sofa and brace himself, then mouth and face twitching, then generalized convulsions, gasping for breath, then stopped. Confusion for 1-2 hours, then returns to baseline. Tolerating LEV.   PRIOR HPI (11/16/14): 33 year old male with history of Down syndrome, congenital solitary kidney, here for evaluation of seizure. No right or history of seizures until 11/10/14, patient was eating breakfast when all of a sudden he made gurgling sounds, had convulsions, fell out of his chair. Seizure lasted for 5 minutes. Patient did have tongue biting with the seizure. He was confused, dazed, for another 15-20 minutes. Patient was gradually returned to baseline by the time he was loaded into the ambulance and came to the emergency room. No recent sleep changes, infections, stresses, traumas.   REVIEW OF SYSTEMS: Out of a complete 14 system review of symptoms, the patient complains only of the  following symptoms, seizure, and all other reviewed systems are negative.  ALLERGIES: No Known Allergies  HOME MEDICATIONS: Outpatient Medications Prior to Visit  Medication Sig Dispense Refill  . levETIRAcetam (KEPPRA) 100 MG/ML solution Take 15 mLs (1,500 mg total) by mouth 2 (two) times daily. 2700 mL 4  . divalproex  (DEPAKOTE SPRINKLE) 125 MG capsule Take 3 capsules (375 mg total) by mouth 2 (two) times daily. 540 capsule 4   No facility-administered medications prior to visit.     PAST MEDICAL HISTORY: Past Medical History:  Diagnosis Date  . Down syndrome   . H/O unilateral nephrectomy   . Seizures (HCC) 12/2014   last sz 03/18/16    PAST SURGICAL HISTORY: Past Surgical History:  Procedure Laterality Date  . ABDOMINAL SURGERY      FAMILY HISTORY: Family History  Problem Relation Age of Onset  . Diabetes Mother     SOCIAL HISTORY: Social History   Socioeconomic History  . Marital status: Single    Spouse name: Not on file  . Number of children: 0  . Years of education: HS  . Highest education level: Not on file  Occupational History    Employer: OTHER    Comment: N/A  Social Needs  . Financial resource strain: Not on file  . Food insecurity:    Worry: Not on file    Inability: Not on file  . Transportation needs:    Medical: Not on file    Non-medical: Not on file  Tobacco Use  . Smoking status: Never Smoker  . Smokeless tobacco: Never Used  Substance and Sexual Activity  . Alcohol use: No    Alcohol/week: 0.0 standard drinks  . Drug use: No  . Sexual activity: Not on file  Lifestyle  . Physical activity:    Days per week: Not on file    Minutes per session: Not on file  . Stress: Not on file  Relationships  . Social connections:    Talks on phone: Not on file    Gets together: Not on file    Attends religious service: Not on file    Active member of club or organization: Not on file    Attends meetings of clubs or organizations: Not on file    Relationship status: Not on file  . Intimate partner violence:    Fear of current or ex partner: Not on file    Emotionally abused: Not on file    Physically abused: Not on file    Forced sexual activity: Not on file  Other Topics Concern  . Not on file  Social History Narrative   Patient lives at home with his  family.   Caffeine Use: none      PHYSICAL EXAM  Vitals:   01/15/19 1336  BP: (!) 144/89  Pulse: 80  Weight: 164 lb 3.2 oz (74.5 kg)  Height: 5\' 1"  (1.549 m)   Body mass index is 31.03 kg/m.  Generalized: Well developed, in no acute distress  Cardiology: normal rate and rhythm, no murmur noted Neurological examination  Mentation: Alert. Smiling. Follows some simple commands.  Cranial nerve II-XII: Pupils were equal round reactive to light. Extraocular movements were full, visual field were full on confrontational test. Facial sensation and strength were normal. Uvula tongue midline.  Motor: The motor testing reveals 5 over 5 strength of all 4 extremities. Good symmetric motor tone is noted throughout.  Sensory: Sensory testing is intact to soft touch on all 4 extremities.  Coordination: unable to assess, patient unable to follow instructions  Gait and station: Gait is narrow.   DIAGNOSTIC DATA (LABS, IMAGING, TESTING) - I reviewed patient records, labs, notes, testing and imaging myself where available.  No flowsheet data found.   Lab Results  Component Value Date   WBC 5.2 01/04/2015   HGB 13.4 01/04/2015   HCT 39.9 01/04/2015   MCV 84.5 01/04/2015   PLT 326 01/04/2015      Component Value Date/Time   NA 137 01/04/2015 1140   K 3.5 01/04/2015 1140   CL 103 01/04/2015 1140   CO2 29 01/04/2015 1140   GLUCOSE 101 (H) 01/04/2015 1140   BUN 11 01/04/2015 1140   CREATININE 1.17 01/04/2015 1140   CALCIUM 9.4 01/04/2015 1140   PROT 7.6 01/04/2015 1140   ALBUMIN 4.0 01/04/2015 1140   AST 23 01/04/2015 1140   ALT 14 01/04/2015 1140   ALKPHOS 88 01/04/2015 1140   BILITOT 0.7 01/04/2015 1140   GFRNONAA 85 (L) 01/04/2015 1140   GFRAA >90 01/04/2015 1140   No results found for: CHOL, HDL, LDLCALC, LDLDIRECT, TRIG, CHOLHDL No results found for: BJYN8G No results found for: VITAMINB12 No results found for: TSH   ASSESSMENT AND PLAN 33 y.o. year old male  has a past  medical history of Down syndrome, H/O unilateral nephrectomy, and Seizures (HCC) (12/2014). here with     ICD-10-CM   1. Partial symptomatic epilepsy with complex partial seizures, not intractable, without status epilepticus (HCC) G40.209 Valproic Acid Level    Ammonia    Unfortunately he continues to have breakthrough seizure activity. Mom reports worsening of dazing spells. We will increase divalproex to  BID. Mom was instructed to monitor his symptoms closely and call with progress report in 2 weeks, sooner for any worsening of symptoms. We will also monitor weight as he has gained about 12 pounds since last year. We will request recent labs from PCP. Valproic acid and ammonia levels checked today. We will follow up in 6 months, sooner if needed.    Orders Placed This Encounter  Procedures  . Valproic Acid Level  . Ammonia     Meds ordered this encounter  Medications  . divalproex (DEPAKOTE SPRINKLE) 125 MG capsule    Sig: Take 4 capsules (500 mg total) by mouth 2 (two) times daily.    Dispense:  560 capsule    Refill:  4    Dispense sprinkles formulation; to mix with applesauce    Order Specific Question:   Supervising Provider    Answer:   Anson Fret [[id number]]       Eriona Kinchen Nicholas Lose, FNP-C 01/15/2019, 3:06 PM [facility] [address], [address] [place], [place] [address] [phone]

## 2019-01-15 ENCOUNTER — Ambulatory Visit: Payer: Medicaid Other | Admitting: Family Medicine

## 2019-01-15 ENCOUNTER — Encounter: Payer: Self-pay | Admitting: Family Medicine

## 2019-01-15 VITALS — BP 144/89 | HR 80 | Ht 61.0 in | Wt 164.2 lb

## 2019-01-15 DIAGNOSIS — G40209 Localization-related (focal) (partial) symptomatic epilepsy and epileptic syndromes with complex partial seizures, not intractable, without status epilepticus: Secondary | ICD-10-CM | POA: Diagnosis not present

## 2019-01-15 MED ORDER — DIVALPROEX SODIUM 125 MG PO CSDR: 500.0000 mg | DELAYED_RELEASE_CAPSULE | Freq: Two times a day (BID) | ORAL | 4 refills | Status: DC

## 2019-01-15 NOTE — Progress Notes (Signed)
I reviewed note and agree with plan.   Suanne Marker, MD 01/15/2019, 4:15 PM Certified in Neurology, Neurophysiology and Neuroimaging  [facility] [address] [place], [place] [address] [phone]

## 2019-01-15 NOTE — Patient Instructions (Addendum)
Increase divalproex to 500mg  twice daily (4 capsules twice daily). Please monitor closely for worsening symptoms. Call office with progress report in 2 weeks, sooner for any concerns of worsening.   Monitor weight   Seizure, Adult When you have a seizure:  Parts of your body may move.  You may have a change in how aware or awake (conscious) you are.  You may shake (convulse). Seizures usually last from 30 seconds to 2 minutes. Usually, they are not harmful unless they last a long time. What are the signs or symptoms? Common symptoms of this condition include:  Shaking (convulsions).  Stiffness in the body.  Passing out (losing consciousness).  Uncontrolled movements in the: ? Arms or legs. ? Eyes. ? Head. ? Mouth. Some people have symptoms right before a seizure happens. These symptoms may include:  Fear.  Worry (anxiety).  Feeling like you are going to throw up (nausea).  Feeling like the room is spinning (vertigo).  Feeling like you saw or heard something before (dj vu).  Odd tastes or smells.  Changes in vision, such as seeing flashing lights or spots. Follow these instructions at home: Medicines   Take over-the-counter and prescription medicines only as told by your doctor.  Do not eat or drink anything that may keep your medicine from working, such as alcohol. Activity  Do not do any activities that would be dangerous if you had another seizure, like driving or swimming. Wait until your doctor says it is safe for you to do them.  If you live in the [place] ask your local DMV (department of motor vehicles) when you can drive.  Get plenty of rest. Teaching others   Teach friends and family what to do when you have a seizure. They should: ? Lay you on the ground. ? Protect your head and body. ? Loosen any tight clothing around your neck. ? Turn you on your side. ? Not hold you down. ? Not put anything into your mouth. ? Know whether or not you need  emergency care. ? Stay with you until you are better. General instructions  Contact your doctor each time you have a seizure.  Avoid anything that gives you seizures.  Keep a seizure diary. Write down: ? What you think caused each seizure. ? What you remember about each seizure.  Keep all follow-up visits as told by your doctor. This is important. Contact a doctor if:  You have another seizure.  You have seizures more often.  There is any change in what happens during your seizures.  You keep having seizures with treatment.  You have symptoms of being sick or having an infection. Get help right away if:  You have a seizure: ? That lasts longer than 5 minutes. ? That is different than seizures you had before. ? That makes it harder to breathe. ? After you hurt your head.  After a seizure, you cannot speak or use a part of your body.  After a seizure, you are confused or have a bad headache.  You have two or more seizures in a row.  You are having seizures more often.  You do not wake up right after a seizure.  You get hurt during a seizure. In an emergency:  These symptoms may be an emergency. Do not wait to see if the symptoms will go away. Get medical help right away. Call your local emergency services (911 in the [place] Do not drive yourself to the hospital. Summary  Seizures  usually last from 30 seconds to 2 minutes. Usually, they are not harmful unless they last a long time.  Do not eat or drink anything that may keep your medicine from working, such as alcohol.  Teach friends and family what to do when you have a seizure.  Contact your doctor each time you have a seizure. This information is not intended to replace advice given to you by your health care provider. Make sure you discuss any questions you have with your health care provider. Document Released: 04/23/2008 Document Revised: 07/30/2018 Document Reviewed: 12/12/2017 Elsevier Interactive Patient  Education  2019 [employer].

## 2019-01-16 LAB — AMMONIA: AMMONIA: 94 ug/dL (ref 36–136)

## 2019-01-16 LAB — VALPROIC ACID LEVEL: Valproic Acid Lvl: 72 ug/mL (ref 50–100)

## 2019-01-20 ENCOUNTER — Telehealth: Payer: Self-pay

## 2019-01-20 NOTE — Telephone Encounter (Signed)
Unable to get in contact with the patient's mother. I left her a voicemail for her to return my call. Office number was provided.

## 2019-01-20 NOTE — Telephone Encounter (Signed)
-----   Message from Shawnie Dapper, NP sent at 01/19/2019  8:04 AM EST ----- Please let mom know that labs look good. I was not able to get a chemistry or CBC. I know she had mentioned that PCP had drawn these labs. Any shot she could get [place] a copy of the results or sign a release for [place] to get them?

## 2019-01-21 NOTE — Telephone Encounter (Signed)
Unable to get in contact with the mother. I left her a voicemail to return my call. Office number was provided.

## 2019-01-30 ENCOUNTER — Other Ambulatory Visit: Payer: Self-pay | Admitting: Diagnostic Neuroimaging

## 2019-02-02 NOTE — Telephone Encounter (Signed)
Called pharmacist at [facility] to question amount of refill in mLs requested. She stated it had to do with his insurance approval and they would not have to dispense a partial bottle of medication.  I advised her we will approve of refill as requested.

## 2019-10-28 ENCOUNTER — Emergency Department (HOSPITAL_COMMUNITY): Payer: Medicaid Other

## 2019-10-28 ENCOUNTER — Inpatient Hospital Stay (HOSPITAL_COMMUNITY)
Admission: EM | Admit: 2019-10-28 | Discharge: 2019-11-10 | DRG: 871 | Disposition: A | Discharge status: Home / Self Care | Payer: Medicaid Other | Attending: Family Medicine | Admitting: Family Medicine

## 2019-10-28 ENCOUNTER — Inpatient Hospital Stay (HOSPITAL_COMMUNITY): Payer: Medicaid Other

## 2019-10-28 DIAGNOSIS — E870 Hyperosmolality and hypernatremia: Secondary | ICD-10-CM | POA: Diagnosis present

## 2019-10-28 DIAGNOSIS — Q909 Down syndrome, unspecified: Secondary | ICD-10-CM | POA: Diagnosis not present

## 2019-10-28 DIAGNOSIS — R0902 Hypoxemia: Secondary | ICD-10-CM

## 2019-10-28 DIAGNOSIS — J9601 Acute respiratory failure with hypoxia: Secondary | ICD-10-CM | POA: Diagnosis present

## 2019-10-28 DIAGNOSIS — J1289 Other viral pneumonia: Secondary | ICD-10-CM | POA: Diagnosis present

## 2019-10-28 DIAGNOSIS — J690 Pneumonitis due to inhalation of food and vomit: Secondary | ICD-10-CM | POA: Diagnosis present

## 2019-10-28 DIAGNOSIS — N1330 Unspecified hydronephrosis: Secondary | ICD-10-CM | POA: Diagnosis present

## 2019-10-28 DIAGNOSIS — G40909 Epilepsy, unspecified, not intractable, without status epilepticus: Secondary | ICD-10-CM | POA: Diagnosis present

## 2019-10-28 DIAGNOSIS — E861 Hypovolemia: Secondary | ICD-10-CM | POA: Diagnosis present

## 2019-10-28 DIAGNOSIS — Z79899 Other long term (current) drug therapy: Secondary | ICD-10-CM | POA: Diagnosis not present

## 2019-10-28 DIAGNOSIS — A4189 Other specified sepsis: Principal | ICD-10-CM | POA: Diagnosis present

## 2019-10-28 DIAGNOSIS — E860 Dehydration: Secondary | ICD-10-CM | POA: Diagnosis present

## 2019-10-28 DIAGNOSIS — Z905 Acquired absence of kidney: Secondary | ICD-10-CM | POA: Diagnosis not present

## 2019-10-28 DIAGNOSIS — Z781 Physical restraint status: Secondary | ICD-10-CM | POA: Diagnosis not present

## 2019-10-28 DIAGNOSIS — E875 Hyperkalemia: Secondary | ICD-10-CM | POA: Diagnosis present

## 2019-10-28 DIAGNOSIS — F419 Anxiety disorder, unspecified: Secondary | ICD-10-CM | POA: Diagnosis present

## 2019-10-28 DIAGNOSIS — Z833 Family history of diabetes mellitus: Secondary | ICD-10-CM

## 2019-10-28 DIAGNOSIS — J189 Pneumonia, unspecified organism: Secondary | ICD-10-CM

## 2019-10-28 DIAGNOSIS — F79 Unspecified intellectual disabilities: Secondary | ICD-10-CM | POA: Diagnosis present

## 2019-10-28 DIAGNOSIS — A419 Sepsis, unspecified organism: Secondary | ICD-10-CM

## 2019-10-28 DIAGNOSIS — U071 COVID-19: Secondary | ICD-10-CM | POA: Diagnosis present

## 2019-10-28 DIAGNOSIS — N179 Acute kidney failure, unspecified: Secondary | ICD-10-CM | POA: Diagnosis present

## 2019-10-28 DIAGNOSIS — R0602 Shortness of breath: Secondary | ICD-10-CM | POA: Diagnosis not present

## 2019-10-28 LAB — COMPREHENSIVE METABOLIC PANEL
ALT: 16 U/L (ref 0–44)
AST: 25 U/L (ref 15–41)
Albumin: 2.7 g/dL — ABNORMAL LOW (ref 3.5–5.0)
Alkaline Phosphatase: 91 U/L (ref 38–126)
Anion gap: 14 (ref 5–15)
BUN: 43 mg/dL — ABNORMAL HIGH (ref 6–20)
CO2: 22 mmol/L (ref 22–32)
Calcium: 8.6 mg/dL — ABNORMAL LOW (ref 8.9–10.3)
Chloride: 109 mmol/L (ref 98–111)
Creatinine, Ser: 3.7 mg/dL — ABNORMAL HIGH (ref 0.61–1.24)
GFR calc Af Amer: 24 mL/min — ABNORMAL LOW (ref >60)
GFR calc non Af Amer: 21 mL/min — ABNORMAL LOW (ref >60)
Glucose, Bld: 146 mg/dL — ABNORMAL HIGH (ref 70–99)
Potassium: 5.3 mmol/L — ABNORMAL HIGH (ref 3.5–5.1)
Sodium: 145 mmol/L (ref 135–145)
Total Bilirubin: 0.4 mg/dL (ref 0.3–1.2)
Total Protein: 7.6 g/dL (ref 6.5–8.1)

## 2019-10-28 LAB — CBC WITH DIFFERENTIAL/PLATELET
Abs Immature Granulocytes: 0.58 10*3/uL — ABNORMAL HIGH (ref 0.00–0.07)
Basophils Absolute: 0 10*3/uL (ref 0.0–0.1)
Basophils Relative: 0 %
Eosinophils Absolute: 0 10*3/uL (ref 0.0–0.5)
Eosinophils Relative: 0 %
HCT: 41.8 % (ref 39.0–52.0)
Hemoglobin: 12.7 g/dL — ABNORMAL LOW (ref 13.0–17.0)
Immature Granulocytes: 4 %
Lymphocytes Relative: 10 %
Lymphs Abs: 1.4 10*3/uL (ref 0.7–4.0)
MCH: 31.7 pg (ref 26.0–34.0)
MCHC: 30.4 g/dL (ref 30.0–36.0)
MCV: 104.2 fL — ABNORMAL HIGH (ref 80.0–100.0)
Monocytes Absolute: 1.4 10*3/uL — ABNORMAL HIGH (ref 0.1–1.0)
Monocytes Relative: 10 %
Neutro Abs: 10.6 10*3/uL — ABNORMAL HIGH (ref 1.7–7.7)
Neutrophils Relative %: 76 %
Platelets: 682 10*3/uL — ABNORMAL HIGH (ref 150–400)
RBC: 4.01 MIL/uL — ABNORMAL LOW (ref 4.22–5.81)
RDW: 15.8 % — ABNORMAL HIGH (ref 11.5–15.5)
WBC: 14.1 10*3/uL — ABNORMAL HIGH (ref 4.0–10.5)
nRBC: 1.6 % — ABNORMAL HIGH (ref 0.0–0.2)

## 2019-10-28 LAB — LACTATE DEHYDROGENASE: LDH: 340 U/L — ABNORMAL HIGH (ref 98–192)

## 2019-10-28 LAB — POC SARS CORONAVIRUS 2 AG -  ED: SARS Coronavirus 2 Ag: POSITIVE — AB

## 2019-10-28 LAB — LACTIC ACID, PLASMA: Lactic Acid, Venous: 1.8 mmol/L (ref 0.5–1.9)

## 2019-10-28 LAB — D-DIMER, QUANTITATIVE: D-Dimer, Quant: 1.31 ug/mL-FEU — ABNORMAL HIGH (ref 0.00–0.50)

## 2019-10-28 LAB — FERRITIN: Ferritin: 255 ng/mL (ref 24–336)

## 2019-10-28 LAB — TRIGLYCERIDES: Triglycerides: 411 mg/dL — ABNORMAL HIGH (ref <150)

## 2019-10-28 LAB — FIBRINOGEN: Fibrinogen: 630 mg/dL — ABNORMAL HIGH (ref 210–475)

## 2019-10-28 LAB — C-REACTIVE PROTEIN: CRP: 8.6 mg/dL — ABNORMAL HIGH (ref <1.0)

## 2019-10-28 MED ORDER — PRO-STAT SUGAR FREE PO LIQD
30.0000 mL | Freq: Two times a day (BID) | ORAL | Status: DC
  Administered 2019-10-29 – 2019-11-09 (×21): 30 mL via ORAL
  Filled 2019-10-28 (×21): qty 30

## 2019-10-28 MED ORDER — SODIUM CHLORIDE 0.9 % IV SOLN
100.0000 mg | INTRAVENOUS | Status: AC
  Administered 2019-10-30 – 2019-11-01 (×4): 100 mg via INTRAVENOUS
  Filled 2019-10-28 (×3): qty 20

## 2019-10-28 MED ORDER — SODIUM CHLORIDE 0.9 % IV SOLN
1.0000 g | Freq: Once | INTRAVENOUS | Status: AC
  Administered 2019-10-29: 1 g via INTRAVENOUS
  Filled 2019-10-28: qty 10

## 2019-10-28 MED ORDER — DEXAMETHASONE SODIUM PHOSPHATE 10 MG/ML IJ SOLN
6.0000 mg | INTRAMUSCULAR | Status: DC
  Administered 2019-10-29 – 2019-11-04 (×7): 6 mg via INTRAVENOUS
  Filled 2019-10-28 (×7): qty 1

## 2019-10-28 MED ORDER — ENOXAPARIN SODIUM 40 MG/0.4ML ~~LOC~~ SOLN: 40.0000 mg | SUBCUTANEOUS | Status: DC

## 2019-10-28 MED ORDER — SODIUM CHLORIDE 0.9 % IV SOLN
200.0000 mg | Freq: Once | INTRAVENOUS | Status: AC
  Administered 2019-10-29: 200 mg via INTRAVENOUS
  Filled 2019-10-28: qty 40

## 2019-10-28 MED ORDER — DEXAMETHASONE SODIUM PHOSPHATE 10 MG/ML IJ SOLN
6.0000 mg | Freq: Once | INTRAMUSCULAR | Status: AC
  Administered 2019-10-28: 6 mg via INTRAVENOUS
  Filled 2019-10-28: qty 1

## 2019-10-28 MED ORDER — SODIUM CHLORIDE 0.9 % IV SOLN
500.0000 mg | INTRAVENOUS | Status: DC
  Administered 2019-10-29 – 2019-10-30 (×2): 500 mg via INTRAVENOUS
  Filled 2019-10-28 (×2): qty 500

## 2019-10-28 MED ORDER — SODIUM CHLORIDE 0.9 % IV SOLN
1.0000 g | INTRAVENOUS | Status: DC
  Administered 2019-10-30 (×2): 1 g via INTRAVENOUS
  Filled 2019-10-28: qty 10

## 2019-10-28 MED ORDER — HALOPERIDOL LACTATE 5 MG/ML IJ SOLN
INTRAMUSCULAR | Status: AC
  Administered 2019-10-28: 22:00:00 via INTRAMUSCULAR
  Filled 2019-10-28: qty 1

## 2019-10-28 MED ORDER — ACETAMINOPHEN 325 MG PO TABS
650.0000 mg | ORAL_TABLET | Freq: Four times a day (QID) | ORAL | Status: DC | PRN
  Administered 2019-11-01: 650 mg via ORAL
  Filled 2019-10-28: qty 2

## 2019-10-28 NOTE — ED Provider Notes (Signed)
[name] EMERGENCY DEPARTMENT Provider Note   CSN: [id number] Arrival date & time: 10/28/19  2103     History   Chief Complaint Chief Complaint  Patient presents with  . Shortness of Breath    HPI Isaac Murphy is a 33 y.o. male.     The history is provided by the patient.  Shortness of Breath Severity:  Moderate Onset quality:  Gradual Timing:  Constant Progression:  Worsening Chronicity:  New Context: URI (cough)   Relieved by:  Nothing Worsened by:  Nothing Associated symptoms: fever   Associated symptoms: no abdominal pain, no chest pain, no cough, no ear pain, no rash, no sore throat, no vomiting and no wheezing   Risk factors: no hx of PE/DVT   Risk factors comment:  Downs syndrome    Past Medical History:  Diagnosis Date  . Down syndrome   . H/O unilateral nephrectomy   . Seizures (HCC) 12/2014   last sz 03/18/16    Patient Active Problem List   Diagnosis Date Noted  . Acute respiratory failure with hypoxia (HCC) 10/28/2019  . Partial symptomatic epilepsy with complex partial seizures, not intractable, without status epilepticus (HCC) 06/23/2015  . Down syndrome 06/23/2015    Past Surgical History:  Procedure Laterality Date  . ABDOMINAL SURGERY          Home Medications    Prior to Admission medications   Medication Sig Start Date End Date Taking? Authorizing Provider  clonazePAM (KLONOPIN) 0.5 MG tablet Take 0.5 mg by mouth daily as needed. 05/11/19  Yes [provider]  cyclobenzaprine (FLEXERIL) 5 MG tablet Take 5 mg by mouth 2 (two) times daily as needed. 10/06/19  Yes [provider]  divalproex (DEPAKOTE SPRINKLE) 125 MG capsule Take 4 capsules (500 mg total) by mouth 2 (two) times daily. 01/15/19  Yes Lomax, Amy, NP  levETIRAcetam (KEPPRA) 100 MG/ML solution TAKE 15 MLS BY MOUTH 2 (TWO) TIMES DAILY. Patient taking differently: Take 1,500 mg by mouth 2 (two) times daily.  02/02/19  Yes Penumalli,  Glenford BayleyVikram R, MD  Pseudoephedrine HCl (SUDAFED PO) Take 1 tablet by mouth as needed (sinus).   Yes [provider]  triamcinolone cream (KENALOG) 0.1 % Apply 1 application topically 2 (two) times daily as needed. 06/22/19  Yes [provider]  Vitamin D, Ergocalciferol, (DRISDOL) 1.25 MG (50000 UT) CAPS capsule Take 50,000 Units by mouth once a week. Sundays 05/22/19  Yes [provider]    Family History Family History  Problem Relation Age of Onset  . Diabetes Mother     Social History Social History   Tobacco Use  . Smoking status: Never Smoker  . Smokeless tobacco: Never Used  Substance Use Topics  . Alcohol use: No    Alcohol/week: 0.0 standard drinks  . Drug use: No     Allergies   Patient has no known allergies.   Review of Systems Review of Systems  Constitutional: Positive for fever. Negative for chills.  HENT: Negative for ear pain and sore throat.   Eyes: Negative for pain and visual disturbance.  Respiratory: Positive for shortness of breath. Negative for cough and wheezing.   Cardiovascular: Negative for chest pain and palpitations.  Gastrointestinal: Negative for abdominal pain and vomiting.  Genitourinary: Negative for dysuria and hematuria.  Musculoskeletal: Negative for arthralgias and back pain.  Skin: Negative for color change and rash.  Neurological: Negative for seizures and syncope.  All other systems reviewed and are negative.  Physical Exam Updated Vital Signs  ED Triage Vitals  Enc Vitals Group     BP 10/28/19 2109 (!) 132/97     Pulse Rate 10/28/19 2113 (!) 118     Resp 10/28/19 2113 (!) 31     Temp 10/28/19 2119 100.2 F (37.9 C)     Temp Source 10/28/19 2119 Rectal     SpO2 10/28/19 2113 100 %     Weight --      Height --      Head Circumference --      Peak Flow --      Pain Score --      Pain Loc --      Pain Edu? --      Excl. in [place]? --     Physical Exam Vitals signs and nursing note reviewed.   Constitutional:      General: He is in acute distress.     Appearance: He is well-developed. He is ill-appearing.  HENT:     Head: Normocephalic and atraumatic.  Eyes:     Extraocular Movements: Extraocular movements intact.     Conjunctiva/sclera: Conjunctivae normal.     Pupils: Pupils are equal, round, and reactive to light.  Neck:     Musculoskeletal: Normal range of motion and neck supple.  Cardiovascular:     Rate and Rhythm: Regular rhythm. Tachycardia present.     Heart sounds: Normal heart sounds. No murmur.  Pulmonary:     Effort: Tachypnea and accessory muscle usage present. No respiratory distress.     Breath sounds: Decreased breath sounds present.  Abdominal:     Palpations: Abdomen is soft.     Tenderness: There is no abdominal tenderness.  Musculoskeletal:     Right lower leg: No edema.     Left lower leg: No edema.  Skin:    General: Skin is warm and dry.     Capillary Refill: Capillary refill takes less than 2 seconds.  Neurological:     General: No focal deficit present.     Mental Status: He is alert.  Psychiatric:        Behavior: Behavior is agitated.      ED Treatments / Results  Labs (all labs ordered are listed, but only abnormal results are displayed) Labs Reviewed  CBC WITH DIFFERENTIAL/PLATELET - Abnormal; Notable for the following components:      Result Value   WBC 14.1 (*)    RBC 4.01 (*)    Hemoglobin 12.7 (*)    MCV 104.2 (*)    RDW 15.8 (*)    Platelets 682 (*)    nRBC 1.6 (*)    Neutro Abs 10.6 (*)    Monocytes Absolute 1.4 (*)    Abs Immature Granulocytes 0.58 (*)    All other components within normal limits  COMPREHENSIVE METABOLIC PANEL - Abnormal; Notable for the following components:   Potassium 5.3 (*)    Glucose, Bld 146 (*)    BUN 43 (*)    Creatinine, Ser 3.70 (*)    Calcium 8.6 (*)    Albumin 2.7 (*)    GFR calc non Af Amer 21 (*)    GFR calc Af Amer 24 (*)    All other components within normal limits   D-DIMER, QUANTITATIVE (NOT AT [facility]) - Abnormal; Notable for the following components:   D-Dimer, Quant 1.31 (*)    All other components within normal limits  LACTATE DEHYDROGENASE - Abnormal; Notable for the following components:  LDH 340 (*)    All other components within normal limits  TRIGLYCERIDES - Abnormal; Notable for the following components:   Triglycerides 411 (*)    All other components within normal limits  FIBRINOGEN - Abnormal; Notable for the following components:   Fibrinogen 630 (*)    All other components within normal limits  C-REACTIVE PROTEIN - Abnormal; Notable for the following components:   CRP 8.6 (*)    All other components within normal limits  POC SARS CORONAVIRUS 2 AG -  ED - Abnormal; Notable for the following components:   SARS Coronavirus 2 Ag POSITIVE (*)    All other components within normal limits  CULTURE, BLOOD (ROUTINE X 2)  CULTURE, BLOOD (ROUTINE X 2)  URINE CULTURE  LACTIC ACID, PLASMA  FERRITIN  LACTIC ACID, PLASMA  PROCALCITONIN  URINALYSIS, ROUTINE W REFLEX MICROSCOPIC  HIV ANTIBODY (ROUTINE TESTING W REFLEX)  HEPATITIS B SURFACE ANTIGEN  CBC WITH DIFFERENTIAL/PLATELET  COMPREHENSIVE METABOLIC PANEL  TSH  SODIUM, URINE, RANDOM  PROTEIN / CREATININE RATIO, URINE  ABO/RH  CYTOLOGY - NON PAP  TROPONIN I (HIGH SENSITIVITY)    EKG EKG Interpretation  Date/Time:  Wednesday October 28 2019 21:11:18 EST Ventricular Rate:  119 PR Interval:    QRS Duration: 67 QT Interval:  316 QTC Calculation: 445 R Axis:   12 Text Interpretation: Sinus tachycardia Low voltage, precordial leads RSR' in V1 or V2, probably normal variant Confirmed by Virgina Norfolk [phone]) on 10/28/2019 10:57:56 PM   Radiology Dg Chest Port 1 View  Result Date: 10/28/2019 CLINICAL DATA:  Lethargy and weakness. EXAM: PORTABLE CHEST 1 VIEW COMPARISON:  11/10/2014 FINDINGS: The heart is enlarged and the mediastinal contours are prominent. Diffuse airspace process  in the lungs worrisome for extensive bilateral pneumonia. No definite pleural effusions. The bony thorax is intact. IMPRESSION: 1. Diffuse airspace process in the lungs worrisome for extensive bilateral pneumonia. 2. No pleural effusions. Electronically Signed   By: Rudie Meyer M.D.   On: 10/28/2019 21:58    Procedures .Critical Care Performed by: Virgina Norfolk, DO Authorized by: Virgina Norfolk, DO   Critical care provider statement:    Critical care time (minutes):  50   Critical care was necessary to treat or prevent imminent or life-threatening deterioration of the following conditions:  Respiratory failure   Critical care was time spent personally by me on the following activities:  Blood draw for specimens, discussions with consultants, discussions with primary provider, evaluation of patient's response to treatment, examination of patient, obtaining history from patient or surrogate, ordering and performing treatments and interventions, ordering and review of laboratory studies, ordering and review of radiographic studies, pulse oximetry, re-evaluation of patient's condition and review of old charts   I assumed direction of critical care for this patient from another provider in my specialty: no     (including critical care time)  Medications Ordered in ED Medications  enoxaparin (LOVENOX) injection 40 mg (has no administration in time range)  acetaminophen (TYLENOL) tablet 650 mg (has no administration in time range)  dexamethasone (DECADRON) injection 6 mg (has no administration in time range)  remdesivir 200 mg in sodium chloride 0.9% 250 mL IVPB (has no administration in time range)    Followed by  remdesivir 100 mg in sodium chloride 0.9 % 100 mL IVPB (has no administration in time range)  feeding supplement (PRO-STAT SUGAR FREE 64) liquid 30 mL (has no administration in time range)  clonazePAM (KLONOPIN) tablet 0.5 mg (has no administration  in time range)  divalproex  (DEPAKOTE SPRINKLE) capsule 500 mg (has no administration in time range)  levETIRAcetam (KEPPRA) 100 MG/ML solution 1,500 mg (has no administration in time range)  cyclobenzaprine (FLEXERIL) tablet 5 mg (has no administration in time range)  cefTRIAXone (ROCEPHIN) 1 g in sodium chloride 0.9 % 100 mL IVPB (has no administration in time range)  azithromycin (ZITHROMAX) 500 mg in sodium chloride 0.9 % 250 mL IVPB (has no administration in time range)  cefTRIAXone (ROCEPHIN) 1 g in sodium chloride 0.9 % 100 mL IVPB (has no administration in time range)  haloperidol lactate (HALDOL) 5 MG/ML injection ( Intramuscular Given 10/28/19 2151)  dexamethasone (DECADRON) injection 6 mg (6 mg Intravenous Given 10/28/19 2232)     Initial Impression / Assessment and Plan / ED Course  I have reviewed the triage vital signs and the nursing notes.  Pertinent labs & imaging results that were available during my care of the patient were reviewed by me and considered in my medical decision making (see chart for details).     Isaac Murphy is a 33 year old male with history of Down syndrome who presents to the ED with shortness of breath.  Patient hypoxic in the 50s upon arrival.  Very agitated.  Likely behavioral given his history of Down syndrome.  Mother states that he gets very agitated in hospitals.  He was given IM Haldol.  Placed in four-point restraints.  Titrated to high flow nasal cannula 15 L.  Blood pressure was normal.  Patient had low-grade fever.  Tachypnea improved.  Heart rate was in the 100s and that improved.  Oxygenation greater than 90% on the high flow nasal cannula.  Sepsis work-up was initiated however patient was positive for coronavirus.  Antibiotics have been held at this time and will defer to medicine as likely viral process.  Patient was given IV Decadron.  Creatinine elevated at 3.7.  Leukocytosis of 14.  D-dimer and other inflammatory markers mildly elevated.  Patient with Covid  pneumonia with new hypoxia requiring high flow nasal cannula at 15 L causing sepsis. Blood cx collected.  Admitted to medicine service in stable condition.  I did discuss the patient with ICU who recommends stepdown bed at this time.  Patient is full code.  This chart was dictated using voice recognition software.  Despite best efforts to proofread,  errors can occur which can change the documentation meaning.    Final Clinical Impressions(s) / ED Diagnoses   Final diagnoses:  Acute respiratory failure with hypoxia (HCC)  AKI (acute kidney injury) (HCC)  Sepsis, due to unspecified organism, unspecified whether acute organ dysfunction present [facility])  COVID-19    ED Discharge Orders    None       Virgina Norfolk, DO 10/29/19 [id number]

## 2019-10-28 NOTE — ED Triage Notes (Addendum)
Pt presents from home via [facility] for lethargy and weakness. Pt has h/o Down's syndrome. Mother states that since breakfast, has been irritable and lethargic. EMS exam- 140/90, RR30, 100.3 axillary, 122HR, 33 EtCO2, 97% NRB, 217CBG, rhonchi to right lung base and ccyanotic nail beds (resolved en route). Pt has had a cough x4 days

## 2019-10-28 NOTE — H&P (Signed)
TRH H&P    Patient Demographics:    Isaac Murphy, is a 33 y.o. male  MRN: [id number]  DOB - [birth year]  Admit Date - 10/28/2019  Referring MD/NP/PA: Ronnald Nian  Outpatient Primary MD for the patient is Nolene Ebbs, MD  Patient coming from:  home  Chief complaint- dyspnea   HPI:    Isaac Murphy  is a 33 y.o. 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  Cough x 4 days. Nonproductive.  Fever today. Pt unable to provide meaningful history due to downs.  Caretaker states, no chest pain,  No palp, no sob, no n/v, no abd pain, no diarrhea, no brbpr.   In ED,  T 100.2, P 118, R 31, Bp 132/97 pox 100% on NRB   CXR IMPRESSION: 1. Diffuse airspace process in the lungs worrisome for extensive bilateral pneumonia. 2. No pleural effusions.  Renal ultrasound  IMPRESSION: 1. Mild right-sided hydronephrosis. 2. Echogenic right kidney which can be seen in patients with medical renal disease. 3. Nonvisualization of the left kidney.  Wbc 14.1, Hgb 12.7, Plt 682 Na 145, K 5.3,  Bun 43, Creatinine 3.70 Alb 2.7 Ast 25, Alt 16 D dimer 1.31 Lactic acid 1.8 LDH 340 Ferritin 255 Crp 8.6,   Blood culture x2 pending  POC Sars positive  Pt will be admitted for covid -19 infection , CAP    Review of systems:    In addition to the HPI above,    No Headache, No changes with Vision or hearing, No problems swallowing food or Liquids, No Chest pain,  No Abdominal pain, No Nausea or Vomiting, bowel movements are regular, No Blood in stool or Urine, No dysuria, No new skin rashes or bruises, No new joints pains-aches,  No new weakness, tingling, numbness in any extremity, No recent weight gain or loss, No polyuria, polydypsia or polyphagia, No significant Mental Stressors.  All other systems reviewed and are negative.    Past History of the following :    Past Medical History:  Diagnosis Date  . Down syndrome   . H/O unilateral nephrectomy   . Seizures ([place]) 12/2014   last sz 03/18/16      Past Surgical History:  Procedure Laterality Date  . ABDOMINAL SURGERY        Social History:      Social History   Tobacco Use  . Smoking status: Never Smoker  . Smokeless tobacco: Never Used  Substance Use Topics  . Alcohol use: No    Alcohol/week: 0.0 standard drinks       Family History :     Family History  Problem Relation Age of Onset  . Diabetes Mother        Home Medications:   Prior to Admission medications   Medication Sig Start Date End Date Taking? Authorizing Provider  clonazePAM (KLONOPIN) 0.5 MG tablet Take 0.5 mg by mouth daily as needed. 05/11/19  Yes [provider]  cyclobenzaprine (FLEXERIL) 5 MG tablet Take 5 mg by mouth 2 (two)  times daily as needed. 10/06/19  Yes [provider]  divalproex (DEPAKOTE SPRINKLE) 125 MG capsule Take 4 capsules (500 mg total) by mouth 2 (two) times daily. 01/15/19  Yes Lomax, Amy, NP  levETIRAcetam (KEPPRA) 100 MG/ML solution TAKE 15 MLS BY MOUTH 2 (TWO) TIMES DAILY. Patient taking differently: Take 1,500 mg by mouth 2 (two) times daily.  02/02/19  Yes Penumalli, Earlean Polka, MD  Pseudoephedrine HCl (SUDAFED PO) Take 1 tablet by mouth as needed (sinus).   Yes [provider]  triamcinolone cream (KENALOG) 0.1 % Apply 1 application topically 2 (two) times daily as needed. 06/22/19  Yes [provider]  Vitamin D, Ergocalciferol, (DRISDOL) 1.25 MG (50000 UT) CAPS capsule Take 50,000 Units by mouth once a week. Sundays 05/22/19  Yes [provider]     Allergies:    No Known Allergies   Physical Exam:   Vitals  Blood pressure (!) 128/96, pulse (!) 104, temperature 100.2 F (37.9 C), temperature source Rectal, resp. rate (!) 35, SpO2 95 %.  1.  General: aoxo1 (person)  2. Psychiatric: euthymic  3. Neurologic: cn2-12 intact,  reflexes 2+ symmetric, diffuse with no clonus, motor 5/5 in all 4 ext  4. HEENMT:  Anicteric, pupils 1.63m symmetric, direct, consensual, near intact Neck: no jvd  5. Respiratory : Bibasilar crackles, no wheezing Slight accessory muscle use  6. Cardiovascular : Tachy s1, s2, no m/g/r  7. Gastrointestinal:  Abd: soft, nt, nd, +bs  8. Skin:  Ext: no c/c/e, no rash  9.Musculoskeletal:  Good ROM    Data Review:    CBC Recent Labs  Lab 10/28/19 2140  WBC 14.1*  HGB 12.7*  HCT 41.8  PLT 682*  MCV 104.2*  MCH 31.7  MCHC 30.4  RDW 15.8*  LYMPHSABS 1.4  MONOABS 1.4*  EOSABS 0.0  BASOSABS 0.0   ------------------------------------------------------------------------------------------------------------------  Results for orders placed or performed during the hospital encounter of 10/28/19 (from the past 48 hour(s))  Lactic acid, plasma     Status: None   Collection Time: 10/28/19  9:40 PM  Result Value Ref Range   Lactic Acid, Venous 1.8 0.5 - 1.9 mmol/L    Comment: Performed at [facility], [place] [address] CBC WITH DIFFERENTIAL     Status: Abnormal   Collection Time: 10/28/19  9:40 PM  Result Value Ref Range   WBC 14.1 (H) 4.0 - 10.5 K/uL   RBC 4.01 (L) 4.22 - 5.81 MIL/uL   Hemoglobin 12.7 (L) 13.0 - 17.0 g/dL   HCT 41.8 39.0 - 52.0 %   MCV 104.2 (H) 80.0 - 100.0 fL   MCH 31.7 26.0 - 34.0 pg   MCHC 30.4 30.0 - 36.0 g/dL   RDW 15.8 (H) 11.5 - 15.5 %   Platelets 682 (H) 150 - 400 K/uL   nRBC 1.6 (H) 0.0 - 0.2 %   Neutrophils Relative % 76 %   Neutro Abs 10.6 (H) 1.7 - 7.7 K/uL   Lymphocytes Relative 10 %   Lymphs Abs 1.4 0.7 - 4.0 K/uL   Monocytes Relative 10 %   Monocytes Absolute 1.4 (H) 0.1 - 1.0 K/uL   Eosinophils Relative 0 %   Eosinophils Absolute 0.0 0.0 - 0.5 K/uL   Basophils Relative 0 %   Basophils Absolute 0.0 0.0 - 0.1 K/uL   Immature Granulocytes 4 %   Abs Immature Granulocytes 0.58 (H) 0.00 - 0.07 K/uL     Comment: Performed at  [facility], [place] [address], [place], [place] [address]  Comprehensive metabolic panel     Status: Abnormal   Collection Time: 10/28/19  9:40 PM  Result Value Ref Range   Sodium 145 135 - 145 mmol/L   Potassium 5.3 (H) 3.5 - 5.1 mmol/L   Chloride 109 98 - 111 mmol/L   CO2 22 22 - 32 mmol/L   Glucose, Bld 146 (H) 70 - 99 mg/dL   BUN 43 (H) 6 - 20 mg/dL   Creatinine, Ser 3.70 (H) 0.61 - 1.24 mg/dL   Calcium 8.6 (L) 8.9 - 10.3 mg/dL   Total Protein 7.6 6.5 - 8.1 g/dL   Albumin 2.7 (L) 3.5 - 5.0 g/dL   AST 25 15 - 41 U/L   ALT 16 0 - 44 U/L   Alkaline Phosphatase 91 38 - 126 U/L   Total Bilirubin 0.4 0.3 - 1.2 mg/dL   GFR calc non Af Amer 21 (L) >60 mL/min   GFR calc Af Amer 24 (L) >60 mL/min   Anion gap 14 5 - 15    Comment: Performed at [facility], [place] [address], [place], [place] [address]  D-dimer, quantitative     Status: Abnormal   Collection Time: 10/28/19  9:40 PM  Result Value Ref Range   D-Dimer, Quant 1.31 (H) 0.00 - 0.50 ug/mL-FEU    Comment: (NOTE) At the manufacturer cut-off of 0.50 ug/mL FEU, this assay has been documented to exclude PE with a sensitivity and negative predictive value of 97 to 99%.  At this time, this assay has not been approved by the FDA to exclude DVT/VTE. Results should be correlated with clinical presentation. Performed at [facility], [place] [address] [place], [place] [address]   Lactate dehydrogenase     Status: Abnormal   Collection Time: 10/28/19  9:40 PM  Result Value Ref Range   LDH 340 (H) 98 - 192 U/L    Comment: Performed at [facility], [place] [address], [place], [place] [address]  Ferritin     Status: None   Collection Time: 10/28/19  9:40 PM  Result Value Ref Range   Ferritin 255 24 - 336 ng/mL    Comment: Performed at [facility], [place] [address] [place], [place] [address]  Triglycerides     Status: Abnormal   Collection Time: 10/28/19  9:40 PM  Result Value Ref Range    Triglycerides 411 (H) <150 mg/dL    Comment: Performed at [facility] [address] [place], [place] [address]  Fibrinogen     Status: Abnormal   Collection Time: 10/28/19  9:40 PM  Result Value Ref Range   Fibrinogen 630 (H) 210 - 475 mg/dL    Comment: Performed at [facility] [address], [place], [place] [address]  C-reactive protein     Status: Abnormal   Collection Time: 10/28/19  9:40 PM  Result Value Ref Range   CRP 8.6 (H) <1.0 mg/dL    Comment: Performed at [facility], [place] [address], [place], [place] [address]  POC SARS Coronavirus 2 Ag-ED - Nasal Swab (BD Veritor Kit)     Status: Abnormal   Collection Time: 10/28/19 10:05 PM  Result Value Ref Range   SARS Coronavirus 2 Ag POSITIVE (A) NEGATIVE    Comment: (NOTE) SARS-CoV-2 antigen PRESENT. Positive results indicate the presence of viral antigens, but clinical correlation with patient history and other  diagnostic information is necessary to determine patient infection status.  Positive results do not rule out bacterial infection or co-infection  with other viruses. False positive results are rare but can occur, and confirmatory RT-PCR testing may be appropriate in some circumstances. The expected result is Negative. Fact Sheet for Patients: PodPark.tn Fact Sheet for Providers: GiftContent.is  This test is not yet approved or cleared by the [place] FDA and  has been authorized for detection and/or diagnosis of SARS-CoV-2 by FDA under an Emergency Use Authorization (EUA).  This EUA will remain in effect (meaning this test can be used) for the duration of  the COVID-19 declaration under Section 564(b)(1) of the Act, 21 U.S.C. section 360bbb-3(b)(1), unless the a uthorization is terminated or revoked sooner.     Chemistries  Recent Labs  Lab 10/28/19 2140  NA 145  K 5.3*  CL 109  CO2 22  GLUCOSE 146*  BUN 43*  CREATININE  3.70*  CALCIUM 8.6*  AST 25  ALT 16  ALKPHOS 91  BILITOT 0.4   ------------------------------------------------------------------------------------------------------------------  ------------------------------------------------------------------------------------------------------------------ GFR: CrCl cannot be calculated (Unknown ideal weight.). Liver Function Tests: Recent Labs  Lab 10/28/19 2140  AST 25  ALT 16  ALKPHOS 91  BILITOT 0.4  PROT 7.6  ALBUMIN 2.7*   No results for input(s): LIPASE, AMYLASE in the last 168 hours. No results for input(s): AMMONIA in the last 168 hours. Coagulation Profile: No results for input(s): INR, PROTIME in the last 168 hours. Cardiac Enzymes: No results for input(s): CKTOTAL, CKMB, CKMBINDEX, TROPONINI in the last 168 hours. BNP (last 3 results) No results for input(s): PROBNP in the last 8760 hours. HbA1C: No results for input(s): HGBA1C in the last 72 hours. CBG: No results for input(s): GLUCAP in the last 168 hours. Lipid Profile: Recent Labs    10/28/19 2140  TRIG 411*   Thyroid Function Tests: No results for input(s): TSH, T4TOTAL, FREET4, T3FREE, THYROIDAB in the last 72 hours. Anemia Panel: Recent Labs    10/28/19 2140  FERRITIN 255    --------------------------------------------------------------------------------------------------------------- Urine analysis:    Component Value Date/Time   COLORURINE COLORLESS (A) 11/10/2014 1301   APPEARANCEUR CLEAR 11/10/2014 1301   LABSPEC 1.004 (L) 11/10/2014 1301   PHURINE 7.5 11/10/2014 1301   GLUCOSEU NEGATIVE 11/10/2014 1301   HGBUR NEGATIVE 11/10/2014 1301   BILIRUBINUR NEGATIVE 11/10/2014 1301   KETONESUR NEGATIVE 11/10/2014 1301   PROTEINUR NEGATIVE 11/10/2014 1301   UROBILINOGEN 0.2 11/10/2014 1301   NITRITE NEGATIVE 11/10/2014 1301   LEUKOCYTESUR NEGATIVE 11/10/2014 1301      Imaging Results:    Dg Chest Port 1 View  Result Date: 10/28/2019 CLINICAL  DATA:  Lethargy and weakness. EXAM: PORTABLE CHEST 1 VIEW COMPARISON:  11/10/2014 FINDINGS: The heart is enlarged and the mediastinal contours are prominent. Diffuse airspace process in the lungs worrisome for extensive bilateral pneumonia. No definite pleural effusions. The bony thorax is intact. IMPRESSION: 1. Diffuse airspace process in the lungs worrisome for extensive bilateral pneumonia. 2. No pleural effusions. Electronically Signed   By: Marijo Sanes M.D.   On: 10/28/2019 21:58    st at 100, nl axis, early R progression, t inversion in 3   Assessment & Plan:    Active Problems:   Acute respiratory failure with hypoxia (HCC)  Acute respiratory failure with hypoxia Secondary to Covid-19, CAP  Covid -19 infection Pt is on high flow [place] Per ED, PCCM consulted and thought appropriate for stepdown Dexamethasone 70m iv qday Remdesivir consult  CAP Blood culture x2 Urine strep antigen Urine legionella antigen Rocephin 1gm iv qday Zithromax 558m iv qday  Seizure do Cont Keppra 15028mpo bid Cont Depakote  Anxiety Cont Clonazepam prn   DVT Prophylaxis-   Lovenox - SCDs   AM Labs Ordered, also please review Full Orders  Family Communication: Admission, patients condition and plan of care including tests being ordered have been discussed with the patient  who indicate understanding and agree with the plan and Code Status.  Code Status:  FULL CODE per patient,  Family present with patient in ED  Admission status: Inpatient: Based on patients clinical presentation and evaluation of above clinical data, I have made determination that patient meets Inpatient criteria at this time. Pt has high risk of clinical deterioration.  Pt will require iv steroids as well as iv remdesivir. Pt will require > 2 nites stay.   Time spent in minutes : 70   JaJani Gravel.D on 10/28/2019 at 11:48 PM

## 2019-10-28 NOTE — ED Notes (Signed)
Attempted to trial RA, spO2 read 30s (pleth detected but pt alert), NRB applied and spo)2 returned to 100%. EDP attempted to transition pt to [place] unable to achieve spO2 >90% with [place]. RT called for high flow

## 2019-10-29 ENCOUNTER — Encounter (HOSPITAL_COMMUNITY): Payer: Self-pay | Admitting: Internal Medicine

## 2019-10-29 ENCOUNTER — Other Ambulatory Visit: Payer: Self-pay

## 2019-10-29 ENCOUNTER — Inpatient Hospital Stay (HOSPITAL_COMMUNITY): Payer: Medicaid Other

## 2019-10-29 DIAGNOSIS — R0602 Shortness of breath: Secondary | ICD-10-CM

## 2019-10-29 DIAGNOSIS — J9601 Acute respiratory failure with hypoxia: Secondary | ICD-10-CM

## 2019-10-29 LAB — COMPREHENSIVE METABOLIC PANEL
ALT: 20 U/L (ref 0–44)
AST: 27 U/L (ref 15–41)
Albumin: 2.8 g/dL — ABNORMAL LOW (ref 3.5–5.0)
Alkaline Phosphatase: 95 U/L (ref 38–126)
Anion gap: 17 — ABNORMAL HIGH (ref 5–15)
BUN: 50 mg/dL — ABNORMAL HIGH (ref 6–20)
CO2: 23 mmol/L (ref 22–32)
Calcium: 8.4 mg/dL — ABNORMAL LOW (ref 8.9–10.3)
Chloride: 107 mmol/L (ref 98–111)
Creatinine, Ser: 3.46 mg/dL — ABNORMAL HIGH (ref 0.61–1.24)
GFR calc Af Amer: 26 mL/min — ABNORMAL LOW (ref >60)
GFR calc non Af Amer: 22 mL/min — ABNORMAL LOW (ref >60)
Glucose, Bld: 130 mg/dL — ABNORMAL HIGH (ref 70–99)
Potassium: 5.5 mmol/L — ABNORMAL HIGH (ref 3.5–5.1)
Sodium: 147 mmol/L — ABNORMAL HIGH (ref 135–145)
Total Bilirubin: 0.3 mg/dL (ref 0.3–1.2)
Total Protein: 7.8 g/dL (ref 6.5–8.1)

## 2019-10-29 LAB — CBC WITH DIFFERENTIAL/PLATELET
Abs Immature Granulocytes: 0.79 10*3/uL — ABNORMAL HIGH (ref 0.00–0.07)
Basophils Absolute: 0.1 10*3/uL (ref 0.0–0.1)
Basophils Relative: 1 %
Eosinophils Absolute: 0 10*3/uL (ref 0.0–0.5)
Eosinophils Relative: 0 %
HCT: 40.2 % (ref 39.0–52.0)
Hemoglobin: 12.2 g/dL — ABNORMAL LOW (ref 13.0–17.0)
Immature Granulocytes: 6 %
Lymphocytes Relative: 10 %
Lymphs Abs: 1.2 10*3/uL (ref 0.7–4.0)
MCH: 31.4 pg (ref 26.0–34.0)
MCHC: 30.3 g/dL (ref 30.0–36.0)
MCV: 103.6 fL — ABNORMAL HIGH (ref 80.0–100.0)
Monocytes Absolute: 1.1 10*3/uL — ABNORMAL HIGH (ref 0.1–1.0)
Monocytes Relative: 9 %
Neutro Abs: 9.6 10*3/uL — ABNORMAL HIGH (ref 1.7–7.7)
Neutrophils Relative %: 74 %
Platelets: 639 10*3/uL — ABNORMAL HIGH (ref 150–400)
RBC: 3.88 MIL/uL — ABNORMAL LOW (ref 4.22–5.81)
RDW: 16 % — ABNORMAL HIGH (ref 11.5–15.5)
WBC: 12.9 10*3/uL — ABNORMAL HIGH (ref 4.0–10.5)
nRBC: 1.2 % — ABNORMAL HIGH (ref 0.0–0.2)

## 2019-10-29 LAB — TROPONIN I (HIGH SENSITIVITY)
Troponin I (High Sensitivity): 93 ng/L — ABNORMAL HIGH (ref <18)
Troponin I (High Sensitivity): 39 ng/L — ABNORMAL HIGH (ref <18)

## 2019-10-29 LAB — ECHOCARDIOGRAM COMPLETE
Height: 61 in
Weight: 2645.52 oz

## 2019-10-29 LAB — TSH: TSH: 5.463 u[IU]/mL — ABNORMAL HIGH (ref 0.350–4.500)

## 2019-10-29 LAB — ABO/RH: ABO/RH(D): A POS

## 2019-10-29 LAB — PROCALCITONIN: Procalcitonin: 0.93 ng/mL

## 2019-10-29 LAB — HIV ANTIBODY (ROUTINE TESTING W REFLEX): HIV Screen 4th Generation wRfx: NONREACTIVE

## 2019-10-29 LAB — HEPATITIS B SURFACE ANTIGEN: Hepatitis B Surface Ag: NONREACTIVE

## 2019-10-29 LAB — LACTIC ACID, PLASMA: Lactic Acid, Venous: 1.2 mmol/L (ref 0.5–1.9)

## 2019-10-29 MED ORDER — LEVETIRACETAM IN NACL 1500 MG/100ML IV SOLN
1500.0000 mg | Freq: Two times a day (BID) | INTRAVENOUS | Status: DC
  Administered 2019-10-29 – 2019-11-01 (×7): 1500 mg via INTRAVENOUS
  Filled 2019-10-29 (×8): qty 100

## 2019-10-29 MED ORDER — ENOXAPARIN SODIUM 30 MG/0.3ML ~~LOC~~ SOLN
30.0000 mg | SUBCUTANEOUS | Status: DC
  Administered 2019-10-29 – 2019-10-30 (×2): 30 mg via SUBCUTANEOUS
  Filled 2019-10-29 (×2): qty 0.3

## 2019-10-29 MED ORDER — SODIUM CHLORIDE 0.9 % IV SOLN: INTRAVENOUS | Status: DC

## 2019-10-29 MED ORDER — VALPROATE SODIUM 500 MG/5ML IV SOLN
500.0000 mg | Freq: Two times a day (BID) | INTRAVENOUS | Status: DC
  Administered 2019-10-29 – 2019-11-01 (×7): 500 mg via INTRAVENOUS
  Filled 2019-10-29 (×8): qty 5

## 2019-10-29 MED ORDER — LEVETIRACETAM 100 MG/ML PO SOLN
1500.0000 mg | Freq: Two times a day (BID) | ORAL | Status: DC
  Administered 2019-10-29: 01:00:00 1500 mg via ORAL
  Filled 2019-10-29 (×3): qty 15

## 2019-10-29 MED ORDER — CLONAZEPAM 0.5 MG PO TABS
0.5000 mg | ORAL_TABLET | Freq: Every day | ORAL | Status: DC | PRN
  Administered 2019-10-31 – 2019-11-08 (×5): 0.5 mg via ORAL
  Filled 2019-10-29 (×7): qty 1

## 2019-10-29 MED ORDER — LORAZEPAM 2 MG/ML IJ SOLN
0.5000 mg | Freq: Four times a day (QID) | INTRAMUSCULAR | Status: DC | PRN
  Administered 2019-10-29 – 2019-11-05 (×11): 0.5 mg via INTRAVENOUS
  Filled 2019-10-29 (×11): qty 1

## 2019-10-29 MED ORDER — DIVALPROEX SODIUM 125 MG PO CSDR
500.0000 mg | DELAYED_RELEASE_CAPSULE | Freq: Two times a day (BID) | ORAL | Status: DC
  Filled 2019-10-29 (×3): qty 4

## 2019-10-29 MED ORDER — SODIUM CHLORIDE 0.45 % IV SOLN
INTRAVENOUS | Status: DC
  Administered 2019-10-29: 12:00:00 via INTRAVENOUS

## 2019-10-29 MED ORDER — CYCLOBENZAPRINE HCL 10 MG PO TABS
5.0000 mg | ORAL_TABLET | Freq: Two times a day (BID) | ORAL | Status: DC | PRN
  Administered 2019-10-31 – 2019-11-09 (×4): 5 mg via ORAL
  Filled 2019-10-29 (×4): qty 1

## 2019-10-29 NOTE — ED Notes (Signed)
In addressing pts blood culture collection; pt is still in an agitated state and this RN spoke with family regarding importance of blood culture collection and believed it would be very irritating for pt and his respiratory status if invasive patient care such as blood culture collection were to be completed.

## 2019-10-29 NOTE — ED Notes (Signed)
Bed placement contacted by this RN r/t patient's room number being pulled, sharita in bed placement advised that patient is now going to room 157 at [facility] due to [facility] changing pts bed. Patients room number on the board now reading 143 at [facility], this RN spoke with Theadora Rama in bed placement who advised they had posted patient's new bed number but [facility] must have changed it again. She advised to take pt to bed 143 as posted on the board.

## 2019-10-29 NOTE — Progress Notes (Signed)
RN updated pt.'s mother on the phone. Mother stated that pt. Walks at home without any assistance. Pt. Has never been put on restraints until last night in the ER. Please facetime mother at [phone]. No further concerns. Will continuo to monitor

## 2019-10-29 NOTE — ED Notes (Signed)
Pts mother informed on no-visitation policy at [facility]

## 2019-10-29 NOTE — Progress Notes (Signed)
PROGRESS NOTE    Isaac Murphy  UVO:[id number] DOB: 1986-10-22 DOA: 10/28/2019 PCP: Fleet Contras, MD    Brief Narrative:  33 year old gentleman with history of Down syndrome, seizure disorders, behavioral abnormalities on multiple medications, lives at home with his mother was brought to the emergency room with lethargy, not eating well being more irritable since morning.  Patient was also having cough for 4 days. In the emergency room, he was hypoxic needing 8 L of oxygen, acute kidney injury, febrile. Diagnosed with COVID-19 pneumonia and acute kidney injury.   Assessment & Plan:   Active Problems:   Acute respiratory failure with hypoxia (HCC)  Pneumonia due to COVID-19 infection, acute hypoxemic respiratory failure: Continue to monitor due to significant symptoms  If able to do we will do chest physiotherapy, incentive spirometry, deep breathing exercises, sputum induction,  mucolytic's and bronchodilators. Supplemental oxygen to keep saturations more than 90%. Covid directed therapy with , steroids, on dexamethasone. remdesivir, day 2/5 antibiotics, on Rocephin and azithromycin due to elevated procalcitonin.  Will assess every day.  Check procalcitonin morning. Due to severity of symptoms, patient will need daily inflammatory markers, liver function test to monitor and direct COVID-19 therapies. VQ scan ordered from admission, he has explanation for hypoxia, unable to travel now on high flow oxygen.  Will hold off on doing a VQ scan.  Acute kidney injury with history of solitary kidney: Previous known kidney functions with creatinine of 1.17 in 2016. Presented with BUN 43/3.7-50/3.47, potassium 5.5.  Evidence of acute kidney injury.  Renal ultrasound with no evidence of hydronephrosis.  Solitary right kidney.  Increased echogenicity suggesting probable underlying chronic kidney disease. Patient has not received any IV fluids since presentation.  With worsening renal  functions, will start patient on low-dose IV fluids and continue to monitor intake and output as well as renal functions.  Down syndrome with seizure disorders: On multiple medications at home.  Currently unable to take oral medications adequately.  Changed to IV Keppra and Depakote.  Also on as needed benzodiazepines, will keep on as needed Ativan.   DVT prophylaxis: Lovenox subcu Code Status: Full code Family Communication: Patient's mother on the phone, patient's sister on the phone Disposition Plan: Remains critically sick in the hospital.   Consultants:   None  Procedures:   None  Antimicrobials:   Rocephin, azithromycin, 10/28/2019>>>  Remdesivir, 10/28/2019>>>   Subjective: Patient seen and examined.  Overnight events noted.  He is very lethargic and sleepy, unable to interact.  Had agitation and was given a dose of Haldol last night.  Remains on high flow oxygen.  Objective: Vitals:   10/29/19 0500 10/29/19 0600 10/29/19 0913 10/29/19 1009  BP: (!) 133/104 (!) 122/94 (!) 121/93 120/88  Pulse: (!) 104 (!) 104 97   Resp: (!) 29 (!) 31 (!) 25 (!) 24  Temp:   98.3 F (36.8 C)   TempSrc:   Axillary   SpO2: 96% 91% 92%   Weight:      Height:        Intake/Output Summary (Last 24 hours) at 10/29/2019 1128 Last data filed at 10/29/2019 0600 Gross per 24 hour  Intake -  Output 100 ml  Net -100 ml   Filed Weights   10/28/19 2315 10/29/19 0430  Weight: 74.5 kg 75 kg    Examination:  General exam: Appears sleepy and lethargic.  Unable to interact.   Respiratory system: Clear to auscultation. Respiratory effort normal.  Mostly conducted airway sounds. Cardiovascular system: S1 &  S2 heard, RRR. No JVD, murmurs, rubs, gallops or clicks. No pedal edema. Gastrointestinal system: Abdomen is nondistended, soft and nontender. No organomegaly or masses felt. Normal bowel sounds heard. Central nervous system: Sleepy and lethargic.  Resists movement of all extremities.  Extremities: Symmetric 5 x 5 power. Skin: No rashes, lesions or ulcers Psychiatry: Judgement and insight appear compromised.       Data Reviewed: I have personally reviewed following labs and imaging studies  CBC: Recent Labs  Lab 10/28/19 2140 10/29/19 0645  WBC 14.1* 12.9*  NEUTROABS 10.6* 9.6*  HGB 12.7* 12.2*  HCT 41.8 40.2  MCV 104.2* 103.6*  PLT 682* 932*   Basic Metabolic Panel: Recent Labs  Lab 10/28/19 2140 10/29/19 0645  NA 145 147*  K 5.3* 5.5*  CL 109 107  CO2 22 23  GLUCOSE 146* 130*  BUN 43* 50*  CREATININE 3.70* 3.46*  CALCIUM 8.6* 8.4*   GFR: Estimated Creatinine Clearance: 26.9 mL/min (A) (by C-G formula based on SCr of 3.46 mg/dL (H)). Liver Function Tests: Recent Labs  Lab 10/28/19 2140 10/29/19 0645  AST 25 27  ALT 16 20  ALKPHOS 91 95  BILITOT 0.4 0.3  PROT 7.6 7.8  ALBUMIN 2.7* 2.8*   No results for input(s): LIPASE, AMYLASE in the last 168 hours. No results for input(s): AMMONIA in the last 168 hours. Coagulation Profile: No results for input(s): INR, PROTIME in the last 168 hours. Cardiac Enzymes: No results for input(s): CKTOTAL, CKMB, CKMBINDEX, TROPONINI in the last 168 hours. BNP (last 3 results) No results for input(s): PROBNP in the last 8760 hours. HbA1C: No results for input(s): HGBA1C in the last 72 hours. CBG: No results for input(s): GLUCAP in the last 168 hours. Lipid Profile: Recent Labs    10/28/19 2140  TRIG 411*   Thyroid Function Tests: Recent Labs    10/29/19 0645  TSH 5.463*   Anemia Panel: Recent Labs    10/28/19 2140  FERRITIN 255   Sepsis Labs: Recent Labs  Lab 10/28/19 2140 10/29/19 0031  PROCALCITON 0.93  --   LATICACIDVEN 1.8 1.2    Recent Results (from the past 240 hour(s))  Blood Culture (routine x 2)     Status: None (Preliminary result)   Collection Time: 10/28/19 10:13 PM   Specimen: BLOOD  Result Value Ref Range Status   Specimen Description BLOOD RIGHT ANTECUBITAL   Final   Special Requests   Final    BOTTLES DRAWN AEROBIC AND ANAEROBIC Blood Culture results may not be optimal due to an inadequate volume of blood received in culture bottles   Culture   Final    NO GROWTH < 12 HOURS Performed at [facility], [place] [address], [place], [place] [address]    Report Status PENDING  Incomplete         Radiology Studies: US RENAL  Result Date: 10/29/2019 CLINICAL DATA:  Acute renal failure EXAM: RENAL / URINARY TRACT ULTRASOUND COMPLETE COMPARISON:  CT dated May 29, 2018. FINDINGS: Right Kidney: Renal measurements: 10.3 by 4 x 3.4 cm = volume: 73 mL. There is increased cortical echogenicity. There is mild hydronephrosis. 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 Bladder: Appears normal for degree of bladder distention. Other: None. IMPRESSION: 1. Mild right-sided hydronephrosis. 2. Echogenic right kidney which can be seen in patients with medical renal disease. 3. Nonvisualization of the left kidney. Electronically Signed   By: Harrell Gave  Green M.D.   On: 10/29/2019 00:34   DG Chest Port 1 View  Result Date: 10/28/2019 CLINICAL DATA:  Lethargy and weakness. EXAM: PORTABLE CHEST 1 VIEW COMPARISON:  11/10/2014 FINDINGS: The heart is enlarged and the mediastinal contours are prominent. Diffuse airspace process in the lungs worrisome for extensive bilateral pneumonia. No definite pleural effusions. The bony thorax is intact. IMPRESSION: 1. Diffuse airspace process in the lungs worrisome for extensive bilateral pneumonia. 2. No pleural effusions. Electronically Signed   By: Rudie MeyerP.  Gallerani M.D.   On: 10/28/2019 21:58        Scheduled Meds: . dexamethasone (DECADRON) injection  6 mg Intravenous Q24H  . divalproex  500 mg Oral BID  . enoxaparin (LOVENOX) injection  30 mg Subcutaneous Q24H  . feeding supplement (PRO-STAT SUGAR FREE 64)  30 mL Oral BID  . levETIRAcetam   1,500 mg Oral BID   Continuous Infusions: . sodium chloride    . azithromycin Stopped (10/29/19 0254)  . cefTRIAXone (ROCEPHIN)  IV    . [START ON 10/30/2019] remdesivir 100 mg in NS 100 mL       LOS: 1 day    Time spent: 30 minutes    Dorcas CarrowKuber Ibrahem Volkman, MD [facility] Pager [phone]

## 2019-10-29 NOTE — ED Notes (Signed)
Urine Collection Bag placed on pt in attempt to collect UA and Urine Culture

## 2019-10-29 NOTE — ED Notes (Addendum)
[facility] called for transport. 

## 2019-10-29 NOTE — ED Notes (Signed)
This RN spoke with Pts mother and other family over phone and instructed them that receiving RN at [facility] would be able to give updates regarding pts care and that they would be able to get in touch with the provider through that method. Phone number and Room Number were given to family.

## 2019-10-29 NOTE — Progress Notes (Signed)
Patient admitted into Room 143. Vitals stable.

## 2019-10-29 NOTE — ED Notes (Signed)
ED TO INPATIENT HANDOFF REPORT  ED Nurse Name and Phone #: Lunette Stands [place] Name/Age/Gender Isaac Murphy 33 y.o. male Room/Bed: 030C/030C  Code Status   Code Status: Full Code  Home/SNF/Other Home Patient oriented to: self, place, time and situation Is this baseline? Yes   Triage Complete: Triage complete  Chief Complaint sepsis  Triage Note Pt presents from home via [facility] for lethargy and weakness. Pt has h/o Down's syndrome. Mother states that since breakfast, has been irritable and lethargic. EMS exam- 140/90, RR30, 100.3 axillary, 122HR, 33 EtCO2, 97% NRB, 217CBG, rhonchi to right lung base and ccyanotic nail beds (resolved en route). Pt has had a cough x4 days    Allergies No Known Allergies  Level of Care/Admitting Diagnosis ED Disposition    ED Disposition Condition [facility] Area: [place] [[id number]]  Level of Care: Progressive [102]  Covid Evaluation: Confirmed COVID Positive  Diagnosis: Acute respiratory failure with hypoxia [facility]) [[id number]]  Admitting Physician: Jani Gravel [[id number]]  Attending Physician: Jani Gravel [phone]  Estimated length of stay: past midnight tomorrow  Certification:: I certify this patient will need inpatient services for at least 2 midnights  PT Class (Do Not Modify): Inpatient [101]  PT Acc Code (Do Not Modify): Private [1]       B Medical/Surgery History Past Medical History:  Diagnosis Date  . Down syndrome   . H/O unilateral nephrectomy   . Seizures ([place]) 12/2014   last sz 03/18/16   Past Surgical History:  Procedure Laterality Date  . ABDOMINAL SURGERY       A IV Location/Drains/Wounds Patient Lines/Drains/Airways Status   Active Line/Drains/Airways    Name:   Placement date:   Placement time:   Site:   Days:   Peripheral IV 01/04/15 Right Forearm   01/04/15    1129    Forearm   1759          Intake/Output Last 24 hours No intake or output data in the 24 hours  ending 10/29/19 0053  Labs/Imaging Results for orders placed or performed during the hospital encounter of 10/28/19 (from the past 48 hour(s))  Lactic acid, plasma     Status: None   Collection Time: 10/28/19  9:40 PM  Result Value Ref Range   Lactic Acid, Venous 1.8 0.5 - 1.9 mmol/L    Comment: Performed at [facility], [place] [address], [place], [place] [address]  CBC WITH DIFFERENTIAL     Status: Abnormal   Collection Time: 10/28/19  9:40 PM  Result Value Ref Range   WBC 14.1 (H) 4.0 - 10.5 K/uL   RBC 4.01 (L) 4.22 - 5.81 MIL/uL   Hemoglobin 12.7 (L) 13.0 - 17.0 g/dL   HCT 41.8 39.0 - 52.0 %   MCV 104.2 (H) 80.0 - 100.0 fL   MCH 31.7 26.0 - 34.0 pg   MCHC 30.4 30.0 - 36.0 g/dL   RDW 15.8 (H) 11.5 - 15.5 %   Platelets 682 (H) 150 - 400 K/uL   nRBC 1.6 (H) 0.0 - 0.2 %   Neutrophils Relative % 76 %   Neutro Abs 10.6 (H) 1.7 - 7.7 K/uL   Lymphocytes Relative 10 %   Lymphs Abs 1.4 0.7 - 4.0 K/uL   Monocytes Relative 10 %   Monocytes Absolute 1.4 (H) 0.1 - 1.0 K/uL   Eosinophils Relative 0 %   Eosinophils Absolute 0.0 0.0 - 0.5 K/uL   Basophils  Relative 0 %   Basophils Absolute 0.0 0.0 - 0.1 K/uL   Immature Granulocytes 4 %   Abs Immature Granulocytes 0.58 (H) 0.00 - 0.07 K/uL    Comment: Performed at [facility], [place] [address] [place], [place] [address]  Comprehensive metabolic panel     Status: Abnormal   Collection Time: 10/28/19  9:40 PM  Result Value Ref Range   Sodium 145 135 - 145 mmol/L   Potassium 5.3 (H) 3.5 - 5.1 mmol/L   Chloride 109 98 - 111 mmol/L   CO2 22 22 - 32 mmol/L   Glucose, Bld 146 (H) 70 - 99 mg/dL   BUN 43 (H) 6 - 20 mg/dL   Creatinine, Ser 3.70 (H) 0.61 - 1.24 mg/dL   Calcium 8.6 (L) 8.9 - 10.3 mg/dL   Total Protein 7.6 6.5 - 8.1 g/dL   Albumin 2.7 (L) 3.5 - 5.0 g/dL   AST 25 15 - 41 U/L   ALT 16 0 - 44 U/L   Alkaline Phosphatase 91 38 - 126 U/L   Total Bilirubin 0.4 0.3 - 1.2 mg/dL   GFR calc non Af Amer 21 (L) >60 mL/min   GFR  calc Af Amer 24 (L) >60 mL/min   Anion gap 14 5 - 15    Comment: Performed at [facility], [place] [address] [place], [place] [address]  D-dimer, quantitative     Status: Abnormal   Collection Time: 10/28/19  9:40 PM  Result Value Ref Range   D-Dimer, Quant 1.31 (H) 0.00 - 0.50 ug/mL-FEU    Comment: (NOTE) At the manufacturer cut-off of 0.50 ug/mL FEU, this assay has been documented to exclude PE with a sensitivity and negative predictive value of 97 to 99%.  At this time, this assay has not been approved by the FDA to exclude DVT/VTE. Results should be correlated with clinical presentation. Performed at [facility], [place] [address], [place], [place] [address]   Lactate dehydrogenase     Status: Abnormal   Collection Time: 10/28/19  9:40 PM  Result Value Ref Range   LDH 340 (H) 98 - 192 U/L    Comment: Performed at [facility], [place] [address], [place], [place] [address]  Ferritin     Status: None   Collection Time: 10/28/19  9:40 PM  Result Value Ref Range   Ferritin 255 24 - 336 ng/mL    Comment: Performed at [facility], [place] [address], [place], [place] [address]  Triglycerides     Status: Abnormal   Collection Time: 10/28/19  9:40 PM  Result Value Ref Range   Triglycerides 411 (H) <150 mg/dL    Comment: Performed at [facility] [address] [place], [place] [address]  Fibrinogen     Status: Abnormal   Collection Time: 10/28/19  9:40 PM  Result Value Ref Range   Fibrinogen 630 (H) 210 - 475 mg/dL    Comment: Performed at [facility] [address] [place], [place] [address]  C-reactive protein     Status: Abnormal   Collection Time: 10/28/19  9:40 PM  Result Value Ref Range   CRP 8.6 (H) <1.0 mg/dL    Comment: Performed at [facility], [place] [address], [place], [place] [address]  POC SARS Coronavirus 2 Ag-ED - Nasal Swab (BD Veritor Kit)     Status: Abnormal   Collection Time: 10/28/19 10:05 PM  Result Value  Ref Range    SARS Coronavirus 2 Ag POSITIVE (A) NEGATIVE    Comment: (NOTE) SARS-CoV-2 antigen PRESENT. Positive results indicate the presence of viral antigens, but clinical correlation with patient history and other diagnostic information is necessary to determine patient infection status.  Positive results do not rule out bacterial infection or co-infection  with other viruses. False positive results are rare but can occur, and confirmatory RT-PCR testing may be appropriate in some circumstances. The expected result is Negative. Fact Sheet for Patients: PodPark.tn Fact Sheet for Providers: GiftContent.is  This test is not yet approved or cleared by the [place] FDA and  has been authorized for detection and/or diagnosis of SARS-CoV-2 by FDA under an Emergency Use Authorization (EUA).  This EUA will remain in effect (meaning this test can be used) for the duration of  the COVID-19 declaration under Section 564(b)(1) of the Act, 21 U.S.C. section 360bbb-3(b)(1), unless the a uthorization is terminated or revoked sooner.    US Renal  Result Date: 10/29/2019 CLINICAL DATA:  Acute renal failure EXAM: RENAL / URINARY TRACT ULTRASOUND COMPLETE COMPARISON:  CT dated May 29, 2018. FINDINGS: Right Kidney: Renal measurements: 10.3 by 4 x 3.4 cm = volume: 73 mL. There is increased cortical echogenicity. There is mild hydronephrosis. 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 Bladder: Appears normal for degree of bladder distention. Other: None. IMPRESSION: 1. Mild right-sided hydronephrosis. 2. Echogenic right kidney which can be seen in patients with medical renal disease. 3. Nonvisualization of the left kidney. Electronically Signed   By: Constance Holster M.D.   On: 10/29/2019 00:34   Dg Chest Port 1 View  Result Date: 10/28/2019 CLINICAL DATA:  Lethargy and  weakness. EXAM: PORTABLE CHEST 1 VIEW COMPARISON:  11/10/2014 FINDINGS: The heart is enlarged and the mediastinal contours are prominent. Diffuse airspace process in the lungs worrisome for extensive bilateral pneumonia. No definite pleural effusions. The bony thorax is intact. IMPRESSION: 1. Diffuse airspace process in the lungs worrisome for extensive bilateral pneumonia. 2. No pleural effusions. Electronically Signed   By: Marijo Sanes M.D.   On: 10/28/2019 21:58    Pending Labs Unresulted Labs (From admission, onward)    Start     Ordered   11/04/19 0500  Creatinine, serum  (enoxaparin (LOVENOX)    CrCl >/= 30 ml/min)  Weekly,   R    Comments: while on enoxaparin therapy    10/28/19 2344   10/29/19 0500  HIV Antibody (routine testing w rflx)  (HIV Antibody (Routine testing w reflex) panel)  Tomorrow morning,   R     10/28/19 2344   10/29/19 0500  ABO/Rh  Once,   STAT     10/28/19 2344   10/29/19 0500  CBC with Differential/Platelet  Daily,   R     10/28/19 2344   10/29/19 0500  Comprehensive metabolic panel  Daily,   R     10/28/19 2344   10/29/19 0500  TSH  Tomorrow morning,   R     10/28/19 2344   10/28/19 2346  Sodium, urine, random  Once,   STAT     10/28/19 2346   10/28/19 2346  Protein / creatinine ratio, urine  Once,   STAT     10/28/19 2346   10/28/19 2344  Hepatitis B surface antigen  Once,   STAT     10/28/19 2344   10/28/19 2140  Lactic acid,  plasma  Now then every 2 hours,   STAT     10/28/19 2140   10/28/19 2140  Blood Culture (routine x 2)  BLOOD CULTURE X 2,   STAT     10/28/19 2140   10/28/19 2140  Procalcitonin  ONCE - STAT,   STAT     10/28/19 2140   10/28/19 2140  Urinalysis, Routine w reflex microscopic  Once,   STAT     10/28/19 2140   10/28/19 2140  Urine culture  ONCE - STAT,   STAT     10/28/19 2140          Vitals/Pain Today's Vitals   10/28/19 2230 10/28/19 2245 10/28/19 2315 10/29/19 0015  BP: (!) 128/91 (!) 122/98 (!) 128/96 (!) 130/95   Pulse: (!) 110 (!) 109 (!) 104 (!) 109  Resp: (!) 35     Temp:      TempSrc:      SpO2: 98% 96% 95% 92%  Weight:   74.5 kg   Height:   _0  (1.549 m)     Isolation Precautions Airborne and Contact precautions  Medications Medications  enoxaparin (LOVENOX) injection 40 mg (has no administration in time range)  acetaminophen (TYLENOL) tablet 650 mg (has no administration in time range)  dexamethasone (DECADRON) injection 6 mg (has no administration in time range)  remdesivir 200 mg in sodium chloride 0.9% 250 mL IVPB (has no administration in time range)    Followed by  remdesivir 100 mg in sodium chloride 0.9 % 100 mL IVPB (has no administration in time range)  feeding supplement (PRO-STAT SUGAR FREE 64) liquid 30 mL (has no administration in time range)  clonazePAM (KLONOPIN) tablet 0.5 mg (has no administration in time range)  divalproex (DEPAKOTE SPRINKLE) capsule 500 mg (has no administration in time range)  levETIRAcetam (KEPPRA) 100 MG/ML solution 1,500 mg (has no administration in time range)  cyclobenzaprine (FLEXERIL) tablet 5 mg (has no administration in time range)  cefTRIAXone (ROCEPHIN) 1 g in sodium chloride 0.9 % 100 mL IVPB (has no administration in time range)  azithromycin (ZITHROMAX) 500 mg in sodium chloride 0.9 % 250 mL IVPB (has no administration in time range)  cefTRIAXone (ROCEPHIN) 1 g in sodium chloride 0.9 % 100 mL IVPB (1 g Intravenous New Bag/Given 10/29/19 0039)  haloperidol lactate (HALDOL) 5 MG/ML injection ( Intramuscular Given 10/28/19 2151)  dexamethasone (DECADRON) injection 6 mg (6 mg Intravenous Given 10/28/19 2232)    Mobility walks     Focused Assessments Pulmonary Assessment Handoff:  Lung sounds:   O2 Device: High Flow Nasal Cannula O2 Flow Rate (L/min): 15 L/min      R Recommendations: See Admitting Provider Note  Report given to:   Additional Notes: Pt is in 4 point soft restraints; pt has down syndrome and is  considerably agitated with most patient care.

## 2019-10-29 NOTE — Plan of Care (Signed)
Explained general overview of expected care and progress with patient.

## 2019-10-29 NOTE — ED Notes (Signed)
EMS at bedside to transport patient to [facility] RN to accompany patient during transport.

## 2019-10-29 NOTE — Progress Notes (Signed)
  Echocardiogram 2D Echocardiogram has been performed.  Darlina Sicilian M 10/29/2019, 9:07 AM

## 2019-10-30 ENCOUNTER — Inpatient Hospital Stay (HOSPITAL_COMMUNITY): Payer: Medicaid Other

## 2019-10-30 DIAGNOSIS — U071 COVID-19: Secondary | ICD-10-CM | POA: Diagnosis present

## 2019-10-30 DIAGNOSIS — G40909 Epilepsy, unspecified, not intractable, without status epilepticus: Secondary | ICD-10-CM

## 2019-10-30 LAB — CBC WITH DIFFERENTIAL/PLATELET
Abs Immature Granulocytes: 0.98 10*3/uL — ABNORMAL HIGH (ref 0.00–0.07)
Basophils Absolute: 0.1 10*3/uL (ref 0.0–0.1)
Basophils Relative: 1 %
Eosinophils Absolute: 0 10*3/uL (ref 0.0–0.5)
Eosinophils Relative: 0 %
HCT: 40.6 % (ref 39.0–52.0)
Hemoglobin: 12.3 g/dL — ABNORMAL LOW (ref 13.0–17.0)
Immature Granulocytes: 9 %
Lymphocytes Relative: 9 %
Lymphs Abs: 1 10*3/uL (ref 0.7–4.0)
MCH: 31.7 pg (ref 26.0–34.0)
MCHC: 30.3 g/dL (ref 30.0–36.0)
MCV: 104.6 fL — ABNORMAL HIGH (ref 80.0–100.0)
Monocytes Absolute: 1.1 10*3/uL — ABNORMAL HIGH (ref 0.1–1.0)
Monocytes Relative: 10 %
Neutro Abs: 8.2 10*3/uL — ABNORMAL HIGH (ref 1.7–7.7)
Neutrophils Relative %: 71 %
Platelets: 599 10*3/uL — ABNORMAL HIGH (ref 150–400)
RBC: 3.88 MIL/uL — ABNORMAL LOW (ref 4.22–5.81)
RDW: 16.1 % — ABNORMAL HIGH (ref 11.5–15.5)
WBC: 11.4 10*3/uL — ABNORMAL HIGH (ref 4.0–10.5)
nRBC: 0.8 % — ABNORMAL HIGH (ref 0.0–0.2)

## 2019-10-30 LAB — URINALYSIS, ROUTINE W REFLEX MICROSCOPIC
Bacteria, UA: NONE SEEN
Bilirubin Urine: NEGATIVE
Glucose, UA: NEGATIVE mg/dL
Hgb urine dipstick: NEGATIVE
Ketones, ur: NEGATIVE mg/dL
Leukocytes,Ua: NEGATIVE
Nitrite: NEGATIVE
Protein, ur: 100 mg/dL — AB
Specific Gravity, Urine: 1.011 (ref 1.005–1.030)
pH: 5 (ref 5.0–8.0)

## 2019-10-30 LAB — COMPREHENSIVE METABOLIC PANEL
ALT: 23 U/L (ref 0–44)
AST: 30 U/L (ref 15–41)
Albumin: 2.8 g/dL — ABNORMAL LOW (ref 3.5–5.0)
Alkaline Phosphatase: 93 U/L (ref 38–126)
Anion gap: 15 (ref 5–15)
BUN: 53 mg/dL — ABNORMAL HIGH (ref 6–20)
CO2: 27 mmol/L (ref 22–32)
Calcium: 9 mg/dL (ref 8.9–10.3)
Chloride: 109 mmol/L (ref 98–111)
Creatinine, Ser: 2.71 mg/dL — ABNORMAL HIGH (ref 0.61–1.24)
GFR calc Af Amer: 35 mL/min — ABNORMAL LOW (ref >60)
GFR calc non Af Amer: 30 mL/min — ABNORMAL LOW (ref >60)
Glucose, Bld: 94 mg/dL (ref 70–99)
Potassium: 4.7 mmol/L (ref 3.5–5.1)
Sodium: 151 mmol/L — ABNORMAL HIGH (ref 135–145)
Total Bilirubin: 0.4 mg/dL (ref 0.3–1.2)
Total Protein: 7.9 g/dL (ref 6.5–8.1)

## 2019-10-30 LAB — GLUCOSE, CAPILLARY: Glucose-Capillary: 104 mg/dL — ABNORMAL HIGH (ref 70–99)

## 2019-10-30 LAB — HEMOGLOBIN A1C
Hgb A1c MFr Bld: 6.6 % — ABNORMAL HIGH (ref 4.8–5.6)
Mean Plasma Glucose: 142.72 mg/dL

## 2019-10-30 LAB — TYPE AND SCREEN
ABO/RH(D): A POS
Antibody Screen: NEGATIVE

## 2019-10-30 MED ORDER — FUROSEMIDE 10 MG/ML IJ SOLN
40.0000 mg | Freq: Once | INTRAMUSCULAR | Status: AC
  Administered 2019-10-30: 40 mg via INTRAVENOUS
  Filled 2019-10-30: qty 4

## 2019-10-30 MED ORDER — TOCILIZUMAB 400 MG/20ML IV SOLN: 8.0000 mg/kg | Freq: Once | INTRAVENOUS | Status: DC

## 2019-10-30 MED ORDER — SODIUM CHLORIDE 0.9% IV SOLUTION: Freq: Once | INTRAVENOUS | Status: DC

## 2019-10-30 MED ORDER — DEXTROSE 5 % IV SOLN
INTRAVENOUS | Status: DC
  Administered 2019-10-30: 13:00:00 via INTRAVENOUS

## 2019-10-30 MED ORDER — METOPROLOL TARTRATE 5 MG/5ML IV SOLN
5.0000 mg | Freq: Four times a day (QID) | INTRAVENOUS | Status: DC | PRN
  Administered 2019-10-30 – 2019-11-01 (×5): 5 mg via INTRAVENOUS
  Filled 2019-10-30 (×5): qty 5

## 2019-10-30 MED ORDER — SODIUM CHLORIDE 0.9 % IV SOLN
3.0000 g | Freq: Three times a day (TID) | INTRAVENOUS | Status: DC
  Administered 2019-10-31 – 2019-11-06 (×20): 3 g via INTRAVENOUS
  Filled 2019-10-30 (×19): qty 8

## 2019-10-30 MED ORDER — FENTANYL CITRATE (PF) 100 MCG/2ML IJ SOLN
25.0000 ug | Freq: Once | INTRAMUSCULAR | Status: AC
  Administered 2019-10-30: 25 ug via INTRAVENOUS
  Filled 2019-10-30: qty 2

## 2019-10-30 MED ORDER — INSULIN ASPART 100 UNIT/ML ~~LOC~~ SOLN
1.0000 [IU] | Freq: Three times a day (TID) | SUBCUTANEOUS | Status: DC
  Administered 2019-11-01 – 2019-11-07 (×19): 1 [IU] via SUBCUTANEOUS

## 2019-10-30 NOTE — Progress Notes (Signed)
RN talked to pt's mother on the phone for daily updated. No further questions.

## 2019-10-30 NOTE — Progress Notes (Signed)
Pt's HR increased to 120's130;s. Respirations increased to 28. Labor breathing. Oxygen increased to 10LHF, sats 88%. MD notified, orders received.

## 2019-10-30 NOTE — Progress Notes (Signed)
PROGRESS NOTE    Isaac Murphy  ZOX:[id number] DOB: [birth year] DOA: 10/28/2019 PCP: Fleet Contras, MD    Brief Narrative:  33 year old gentleman with history of Down's syndrome, seizure disorders, behavioral abnormalities on multiple medications, lives at home with his mother was brought to the emergency room with lethargy, not eating well being more irritable since morning.  Patient was also having cough for 4 days. In the emergency room, he was hypoxic needing 8 L of oxygen, acute kidney injury, febrile. Diagnosed with COVID-19 pneumonia and acute kidney injury.   Assessment & Plan:   Active Problems:   Acute respiratory failure with hypoxia (HCC)  Pneumonia due to COVID-19 infection, acute hypoxemic respiratory failure: Continue to monitor due to significant symptoms , patient is still has significant oxygen requirement. If able to do we will do chest physiotherapy, incentive spirometry, deep breathing exercises, sputum induction,  mucolytic's and bronchodilators. Supplemental oxygen to keep saturations more than 90%. Covid directed therapy with , steroids, on dexamethasone. remdesivir, day 3/5 antibiotics, on Rocephin and azithromycin due to elevated procalcitonin.  Discontinue azithromycin.  Continue Rocephin today.  Due to severity of symptoms, patient will need daily inflammatory markers, liver function test to monitor and direct COVID-19 therapies.  Acute kidney injury with history of solitary kidney: Previous known kidney functions with creatinine of 1.17 in 2016. Presented with BUN 43/3.7-50/3.47, potassium 5.5.  Evidence of acute kidney injury.  Renal ultrasound with no evidence of hydronephrosis.  Solitary right kidney.  Increased echogenicity suggesting probable underlying chronic kidney disease. Started on IV fluids with some clinical improvement.  Continue IV fluids, changed to dextrose with hypernatremia.  Encourage oral liquids.  Hypovolemia with  hypernatremia: Not adequate oral intake.  On half-normal saline, will change to dextrose infusion.  Monitor output.  Down syndrome with seizure disorders: On multiple medications at home.  Currently unable to take oral medications adequately.  Changed to IV Keppra and Depakote.  Also on as needed benzodiazepines, will keep on as needed Ativan. Continue IV therapies today as patient does not have any reliable intake.   DVT prophylaxis: Lovenox subcu Code Status: Full code Family Communication: Patient's mother on the phone, Disposition Plan: Remains critically sick in the hospital.   Consultants:   None  Procedures:   None  Antimicrobials:   Rocephin, 10/28/2019>>>  Azithromycin, 10/28/2019>>10/30/2019  Remdesivir, 10/28/2019>>>   Subjective: Patient seen and examined.  Wanted to get out of bed and given a dose of Ativan and now sleepy.  Patient continues to require high flow oxygen.  Remains afebrile.  No other overnight events.  Objective: Vitals:   10/29/19 2000 10/29/19 2100 10/30/19 0300 10/30/19 0810  BP: 130/90 126/89 (!) 126/92 135/82  Pulse: (!) 116 (!) 110 (!) 114 (!) 110  Resp: (!) 27 (!) 25 (!) 28 19  Temp: 97.7 F (36.5 C)  97.8 F (36.6 C) 97.6 F (36.4 C)  TempSrc: Axillary  Axillary Axillary  SpO2: 90% 92%  94%  Weight:      Height:        Intake/Output Summary (Last 24 hours) at 10/30/2019 1110 Last data filed at 10/30/2019 0400 Gross per 24 hour  Intake 1663.43 ml  Output 1400 ml  Net 263.43 ml   Filed Weights   10/28/19 2315 10/29/19 0430  Weight: 74.5 kg 75 kg    Examination:  General exam: Appears sleepy but arousable.  Follows simple commands.  Incomprehensible answers. Respiratory system: Clear to auscultation. Respiratory effort normal.  Mostly conducted airway sounds.  Cardiovascular system: S1 & S2 heard, RRR. No JVD, murmurs, rubs, gallops or clicks. No pedal edema. Gastrointestinal system: Abdomen is nondistended, soft and  nontender. No organomegaly or masses felt. Normal bowel sounds heard. Central nervous system: Sleepy but arousable.  Resists movement of all extremities. Extremities: Symmetric 5 x 5 power. Skin: No rashes, lesions or ulcers Psychiatry: Judgement and insight appear compromised.       Data Reviewed: I have personally reviewed following labs and imaging studies  CBC: Recent Labs  Lab 10/28/19 2140 10/29/19 0645 10/30/19 0500  WBC 14.1* 12.9* 11.4*  NEUTROABS 10.6* 9.6* 8.2*  HGB 12.7* 12.2* 12.3*  HCT 41.8 40.2 40.6  MCV 104.2* 103.6* 104.6*  PLT 682* 639* 599*   Basic Metabolic Panel: Recent Labs  Lab 10/28/19 2140 10/29/19 0645 10/30/19 0500  NA 145 147* 151*  K 5.3* 5.5* 4.7  CL 109 107 109  CO2 GLUCOSE 146* 130* 94  BUN 43* 50* 53*  CREATININE 3.70* 3.46* 2.71*  CALCIUM 8.6* 8.4* 9.0   GFR: Estimated Creatinine Clearance: 34.3 mL/min (A) (by C-G formula based on SCr of 2.71 mg/dL (H)). Liver Function Tests: Recent Labs  Lab 10/28/19 2140 10/29/19 0645 10/30/19 0500  AST ALT ALKPHOS 91 95 93  BILITOT 0.4 0.3 0.4  PROT 7.6 7.8 7.9  ALBUMIN 2.7* 2.8* 2.8*   No results for input(s): LIPASE, AMYLASE in the last 168 hours. No results for input(s): AMMONIA in the last 168 hours. Coagulation Profile: No results for input(s): INR, PROTIME in the last 168 hours. Cardiac Enzymes: No results for input(s): CKTOTAL, CKMB, CKMBINDEX, TROPONINI in the last 168 hours. BNP (last 3 results) No results for input(s): PROBNP in the last 8760 hours. HbA1C: No results for input(s): HGBA1C in the last 72 hours. CBG: No results for input(s): GLUCAP in the last 168 hours. Lipid Profile: Recent Labs    10/28/19 2140  TRIG 411*   Thyroid Function Tests: Recent Labs    10/29/19 0645  TSH 5.463*   Anemia Panel: Recent Labs    10/28/19 2140  FERRITIN 255   Sepsis Labs: Recent Labs  Lab 10/28/19 2140 10/29/19 0031  PROCALCITON  0.93  --   LATICACIDVEN 1.8 1.2    Recent Results (from the past 240 hour(s))  Blood Culture (routine x 2)     Status: None (Preliminary result)   Collection Time: 10/28/19 10:13 PM   Specimen: BLOOD  Result Value Ref Range Status   Specimen Description BLOOD RIGHT ANTECUBITAL  Final   Special Requests   Final    BOTTLES DRAWN AEROBIC AND ANAEROBIC Blood Culture results may not be optimal due to an inadequate volume of blood received in culture bottles   Culture   Final    NO GROWTH < 12 HOURS Performed at [facility], [address], [place], [place] [address]    Report Status PENDING  Incomplete         Radiology Studies: US RENAL  Result Date: 10/29/2019 CLINICAL DATA:  Acute renal failure EXAM: RENAL / URINARY TRACT ULTRASOUND COMPLETE COMPARISON:  CT dated May 29, 2018. FINDINGS: Right Kidney: Renal measurements: 10.3 by 4 x 3.4 cm = volume: 73 mL. There is increased cortical echogenicity. There is mild hydronephrosis. 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 Bladder: Appears normal for degree of bladder distention. Other: None. IMPRESSION:  1. Mild right-sided hydronephrosis. 2. Echogenic right kidney which can be seen in patients with medical renal disease. 3. Nonvisualization of the left kidney. Electronically Signed   By: Constance Holster M.D.   On: 10/29/2019 00:34   DG Chest Port 1 View  Result Date: 10/28/2019 CLINICAL DATA:  Lethargy and weakness. EXAM: PORTABLE CHEST 1 VIEW COMPARISON:  11/10/2014 FINDINGS: The heart is enlarged and the mediastinal contours are prominent. Diffuse airspace process in the lungs worrisome for extensive bilateral pneumonia. No definite pleural effusions. The bony thorax is intact. IMPRESSION: 1. Diffuse airspace process in the lungs worrisome for extensive bilateral pneumonia. 2. No pleural effusions. Electronically Signed   By: Marijo Sanes M.D.   On:  10/28/2019 21:58   ECHOCARDIOGRAM COMPLETE  Result Date: 10/29/2019   ECHOCARDIOGRAM REPORT   Patient Name:   RHYATT MUSKA Date of Exam: 10/29/2019 Medical Rec #:  [id number]            Height:       61.0 in Accession #:    [id number]           Weight:       165.3 lb Date of Birth:  07/29/1986             BSA:          1.74 m Patient Age:    33 years             BP:           130/103 mmHg Patient Gender: M                    HR:           106 bpm. Exam Location:  Inpatient Procedure: 2D Echo Indications:    Tachycardia [[id number]]  History:        Patient has prior history of Echocardiogram examinations, most                 recent 07/04/2018. Signs/Symptoms:Shortness of Breath. [place]. Down syndrome.  Sonographer:    Darlina Sicilian RDCS Referring Phys: [place]  1. Left ventricular ejection fraction, by visual estimation, is 50 to 55%. The left ventricle has normal function. There is no left ventricular hypertrophy.  2. Left ventricular diastolic parameters are consistent with Grade I diastolic dysfunction (impaired relaxation).  3. The left ventricle has no regional wall motion abnormalities.  4. Global right ventricle has normal systolic function.The right ventricular size is normal.  5. Left atrial size was normal.  6. Right atrial size was normal.  7. Small pericardial effusion.  8. The mitral valve is normal in structure. No evidence of mitral valve regurgitation. No evidence of mitral stenosis.  9. The tricuspid valve is normal in structure. Tricuspid valve regurgitation is trivial. 10. The aortic valve is normal in structure. Aortic valve regurgitation is not visualized. No evidence of aortic valve sclerosis or stenosis. 11. The pulmonic valve was normal in structure. Pulmonic valve regurgitation is not visualized. 12. The inferior vena cava is normal in size with greater than 50% respiratory variability, suggesting right atrial pressure of 3 mmHg. 13.  Technically difficult; low normal LV systolic function; grade 1 diastolic dysfunction; oscillating density in RA likely chiari network; small pericardial effusion. FINDINGS  Left Ventricle: Left ventricular ejection fraction, by visual estimation, is 50 to 55%. The left  ventricle has normal function. The left ventricle has no regional wall motion abnormalities. There is no left ventricular hypertrophy. Left ventricular diastolic parameters are consistent with Grade I diastolic dysfunction (impaired relaxation). Normal left atrial pressure. Right Ventricle: The right ventricular size is normal.Global RV systolic function is has normal systolic function. Left Atrium: Left atrial size was normal in size. Right Atrium: Right atrial size was normal in size Prominent Chiari network. Pericardium: A small pericardial effusion is present. Mitral Valve: The mitral valve is normal in structure. No evidence of mitral valve regurgitation. No evidence of mitral valve stenosis by observation. Tricuspid Valve: The tricuspid valve is normal in structure. Tricuspid valve regurgitation is trivial. Aortic Valve: The aortic valve is normal in structure. Aortic valve regurgitation is not visualized. The aortic valve is structurally normal, with no evidence of sclerosis or stenosis. Pulmonic Valve: The pulmonic valve was normal in structure. Pulmonic valve regurgitation is not visualized. Pulmonic regurgitation is not visualized. Aorta: The aortic root is normal in size and structure. Venous: The inferior vena cava is normal in size with greater than 50% respiratory variability, suggesting right atrial pressure of 3 mmHg.  Additional Comments: Technically difficult; low normal LV systolic function; grade 1 diastolic dysfunction; oscillating density in RA likely chiari network; small pericardial effusion.  LEFT VENTRICLE PLAX 2D LVIDd:         4.02 cm  Diastology LVIDs:         2.97 cm  LV e' lateral:   8.05 cm/s LV PW:         0.80 cm  LV  E/e' lateral: 10.5 LV IVS:        1.08 cm  LV e' medial:    8.81 cm/s LVOT diam:     2.20 cm  LV E/e' medial:  9.6 LV SV:         37 ml LV SV Index:   20.09 LVOT Area:     3.80 cm  RIGHT VENTRICLE RV S prime:     10.70 cm/s LEFT ATRIUM             Index LA diam:        2.50 cm 1.44 cm/m LA Vol (A2C):   20.4 ml 11.71 ml/m LA Vol (A4C):   12.2 ml 7.00 ml/m LA Biplane Vol: 16.9 ml 9.70 ml/m  AORTIC VALVE LVOT Vmax:   68.30 cm/s LVOT Vmean:  43.500 cm/s LVOT VTI:    0.105 m  AORTA Ao Root diam: 3.40 cm Ao Asc diam:  2.60 cm MITRAL VALVE MV Area (PHT): 6.07 cm              SHUNTS MV PHT:        36.25 msec            Systemic VTI:  0.10 m MV Decel Time: 125 msec              Systemic Diam: 2.20 cm MV E velocity: 84.20 cm/s  103 cm/s MV A velocity: 100.00 cm/s 70.3 cm/s MV E/A ratio:  0.84        1.5  Olga MillersBrian Crenshaw MD Electronically signed by Olga MillersBrian Crenshaw MD Signature Date/Time: 10/29/2019/12:13:21 PM    Final         Scheduled Meds: . dexamethasone (DECADRON) injection  6 mg Intravenous Q24H  . enoxaparin (LOVENOX) injection  30 mg Subcutaneous Q24H  . feeding supplement (PRO-STAT SUGAR FREE 64)  30 mL Oral BID   Continuous Infusions: . sodium chloride  75 mL/hr at 10/29/19 1152  . azithromycin 250 mL/hr at 10/30/19 0400  . cefTRIAXone (ROCEPHIN)  IV 1 g (10/30/19 0154)  . levETIRAcetam 1,500 mg (10/30/19 0000)  . remdesivir 100 mg in NS 100 mL    . valproate sodium 500 mg (10/30/19 0927)     LOS: 2 days    Time spent: 30 minutes    Dorcas Carrow, MD [facility] Pager [phone]

## 2019-10-30 NOTE — Progress Notes (Signed)
Md at bedside evaluating pt. HR 133. Lopressor 5 mg ivp given. HR 108.will continou to monitor

## 2019-10-30 NOTE — Progress Notes (Addendum)
Patient with increasing oxygen requirement. Repeat chest x-ray with florid pneumonia and interstitial edema. IV fluid discontinued.  40 mg of Lasix 1 dose.  Call placed and discussed with patient's mother regarding 2 more modalities of treatment.  #1. convalescent plasma: Experimental.  patient with worsening hypoxemia.  Discussed with patient's mother that this is one of the experimental modality of COVID 19 treatment that involves giving plasma from patients who have recovered from infection.  This is one of the blood product. This may or may not benefit in the current state of disease. in some instatnces, it may improve survival and recovery.  Side effects include reactions from plasma proteins that are foreign bodies and very rare chances of infections that are treatable.  Patient is agreeable for convalescent plasma transfusion and has signed the consent with witness.   #2. Actemra or Tocilizumab: Patient has no absolute contraindication to this therapy.  Liver function test is acceptable.  Discussed rare side effects including reactivation of old infection or fungal infection that are treatable.  Patient's mother agreeable to try these experimental therapies.  Renal functions may get worse with these therapies including diuretics, we have to take a chance.  Will have close monitoring of renal functions.  Explained in detail to patient's mother.  Addendum: Suspect aspiration pneumonia with less responsiveness and increased work of breathing.  Will not give Actemra.  Working on consent for convalescent plasma.

## 2019-10-31 ENCOUNTER — Inpatient Hospital Stay (HOSPITAL_COMMUNITY): Payer: Medicaid Other

## 2019-10-31 LAB — COMPREHENSIVE METABOLIC PANEL
ALT: 31 U/L (ref 0–44)
AST: 46 U/L — ABNORMAL HIGH (ref 15–41)
Albumin: 3.2 g/dL — ABNORMAL LOW (ref 3.5–5.0)
Alkaline Phosphatase: 92 U/L (ref 38–126)
Anion gap: 14 (ref 5–15)
BUN: 55 mg/dL — ABNORMAL HIGH (ref 6–20)
CO2: 27 mmol/L (ref 22–32)
Calcium: 9.5 mg/dL (ref 8.9–10.3)
Chloride: 109 mmol/L (ref 98–111)
Creatinine, Ser: 2.53 mg/dL — ABNORMAL HIGH (ref 0.61–1.24)
GFR calc Af Amer: 38 mL/min — ABNORMAL LOW (ref >60)
GFR calc non Af Amer: 33 mL/min — ABNORMAL LOW (ref >60)
Glucose, Bld: 106 mg/dL — ABNORMAL HIGH (ref 70–99)
Potassium: 5.3 mmol/L — ABNORMAL HIGH (ref 3.5–5.1)
Sodium: 150 mmol/L — ABNORMAL HIGH (ref 135–145)
Total Bilirubin: 0.7 mg/dL (ref 0.3–1.2)
Total Protein: 8.6 g/dL — ABNORMAL HIGH (ref 6.5–8.1)

## 2019-10-31 LAB — CBC WITH DIFFERENTIAL/PLATELET
Abs Immature Granulocytes: 0.6 10*3/uL — ABNORMAL HIGH (ref 0.00–0.07)
Band Neutrophils: 6 %
Basophils Absolute: 0 10*3/uL (ref 0.0–0.1)
Basophils Relative: 0 %
Eosinophils Absolute: 0 10*3/uL (ref 0.0–0.5)
Eosinophils Relative: 0 %
HCT: 45 % (ref 39.0–52.0)
Hemoglobin: 13.6 g/dL (ref 13.0–17.0)
Lymphocytes Relative: 8 %
Lymphs Abs: 1 10*3/uL (ref 0.7–4.0)
MCH: 31.6 pg (ref 26.0–34.0)
MCHC: 30.2 g/dL (ref 30.0–36.0)
MCV: 104.7 fL — ABNORMAL HIGH (ref 80.0–100.0)
Metamyelocytes Relative: 1 %
Monocytes Absolute: 1.4 10*3/uL — ABNORMAL HIGH (ref 0.1–1.0)
Monocytes Relative: 11 %
Myelocytes: 3 %
Neutro Abs: 9.5 10*3/uL — ABNORMAL HIGH (ref 1.7–7.7)
Neutrophils Relative %: 70 %
Platelets: 668 10*3/uL — ABNORMAL HIGH (ref 150–400)
Promyelocytes Relative: 1 %
RBC: 4.3 MIL/uL (ref 4.22–5.81)
RDW: 16.1 % — ABNORMAL HIGH (ref 11.5–15.5)
WBC: 12.5 10*3/uL — ABNORMAL HIGH (ref 4.0–10.5)
nRBC: 1.4 % — ABNORMAL HIGH (ref 0.0–0.2)

## 2019-10-31 LAB — GLUCOSE, CAPILLARY
Glucose-Capillary: 97 mg/dL (ref 70–99)
Glucose-Capillary: 120 mg/dL — ABNORMAL HIGH (ref 70–99)
Glucose-Capillary: 111 mg/dL — ABNORMAL HIGH (ref 70–99)
Glucose-Capillary: 97 mg/dL (ref 70–99)
Glucose-Capillary: 93 mg/dL (ref 70–99)
Glucose-Capillary: 128 mg/dL — ABNORMAL HIGH (ref 70–99)

## 2019-10-31 LAB — MAGNESIUM: Magnesium: 2.7 mg/dL — ABNORMAL HIGH (ref 1.7–2.4)

## 2019-10-31 LAB — PHOSPHORUS: Phosphorus: 3.2 mg/dL (ref 2.5–4.6)

## 2019-10-31 LAB — PROCALCITONIN: Procalcitonin: 0.31 ng/mL

## 2019-10-31 MED ORDER — SODIUM CHLORIDE 0.9% FLUSH
10.0000 mL | INTRAVENOUS | Status: DC | PRN
  Administered 2019-11-01: 10 mL

## 2019-10-31 MED ORDER — METOPROLOL TARTRATE 25 MG PO TABS
12.5000 mg | ORAL_TABLET | Freq: Two times a day (BID) | ORAL | Status: DC
  Administered 2019-10-31 – 2019-11-03 (×6): 12.5 mg via ORAL
  Filled 2019-10-31 (×5): qty 1

## 2019-10-31 MED ORDER — SODIUM CHLORIDE 0.9% FLUSH
10.0000 mL | Freq: Two times a day (BID) | INTRAVENOUS | Status: DC
  Administered 2019-10-31 – 2019-11-09 (×13): 10 mL

## 2019-10-31 MED ORDER — ENOXAPARIN SODIUM 40 MG/0.4ML ~~LOC~~ SOLN
40.0000 mg | SUBCUTANEOUS | Status: DC
  Administered 2019-10-31 – 2019-11-09 (×10): 40 mg via SUBCUTANEOUS
  Filled 2019-10-31 (×10): qty 0.4

## 2019-10-31 NOTE — Progress Notes (Signed)
PT Cancellation Note  Patient Details Name: Isaac Murphy MRN: [id number] DOB: Aug 11, 1986   Cancelled Treatment:    Reason Eval/Treat Not Completed: Patient not medically ready  Patient's resting HR 120s, RR 20-30, sats low 80s. Per RN pt is currently easily agitated and she is working to get his HR down.   Agree pt is appropriate for PT evaluation (per RN he walked at home with no device). Will re-attempt later today as appropriate.   Barry Brunner, PT Pager [phone]   Rexanne Mano 10/31/2019, 9:17 AM

## 2019-10-31 NOTE — Evaluation (Signed)
Physical Therapy Treatment Patient Details Name: Isaac Murphy MRN: [id number] DOB: 1985/12/04 Today's Date: 10/31/2019    History of Present Illness 33 y.o. male with h/o Down syndrome, seizure disorder, on multiple meds, lives at home with his mother, brought to ED with lethargy and weakness. Dx with COVID-19 and transferred to [facility]; acute hypoxic respiratory failure, AKI     PT Comments    Pt admitted with above diagnosis. Per RN ok to work with pt. Patient with eyes open, following/tracking and reaching towards me on arrival. As session progressed, noticed pt not answering yes/no questions as he had earlier today with SLP and then noticed pt received ativan after SLP worked with him and 3 hrs prior to PT eval. Per RN report, he was ambulating at home with no device. Today he could not obtain sitting EOB with +1 max assist. Will need to speak to his mother re: home set-up and hope to progress pt next session without ativan in his system. Pt currently with functional limitations due to the deficits listed below (see PT Problem List). Pt will benefit from skilled PT to increase their independence and safety with mobility to allow discharge to the venue listed below.      Follow Up Recommendations  Other (comment)(to further assess when less lethargic)     Equipment Recommendations  None recommended by PT    Recommendations for Other Services OT consult     Precautions / Restrictions Precautions Precautions: Fall    Mobility  Bed Mobility Overal bed mobility: Needs Assistance Bed Mobility: Rolling;Sidelying to Sit Rolling: Total assist Sidelying to sit: Total assist       General bed mobility comments: pt looking to his right, therefore attempted roll right, pt ?reaching for rail, but would not grasp rail once it was in reach; PT moved legs off EOB and faciltated raising trunk up towards sitting, pt did not assist and after PT rested his trunk back on the mattress, he did  pull both legs back up on the bed and roll onto his back  Transfers                    Ambulation/Gait                 Stairs             Wheelchair Mobility    Modified Rankin (Stroke Patients Only)       Balance Overall balance assessment: (unable to assess)                                          Cognition Arousal/Alertness: Lethargic;Suspect due to medications(ativan 3 hrs prior to eval) Behavior During Therapy: Restless(reaching up in the air, pulling on covers, gown, PTs hand) Overall Cognitive Status: No family/caregiver present to determine baseline cognitive functioning                                 General Comments: Per SLP eval (prior to ativan) pt able to answer yes/no questions      Exercises      General Comments General comments (skin integrity, edema, etc.): Patient was easily aroused and eyes open throughout session, however not following commands or answering yes/no ?'s as he did for SLP prior to receiving ativan.  Pertinent Vitals/Pain Pain Assessment: Faces Faces Pain Scale: No hurt    Home Living Family/patient expects to be discharged to:: Private residence Living Arrangements: Parent Available Help at Discharge: Family;Available 24 hours/day           Additional Comments: Will need to contact pt's mother re: home setup    Prior Function Level of Independence: Needs assistance  Gait / Transfers Assistance Needed: independent with ambulation, no device per nursing report       PT Goals (current goals can now be found in the care plan section) Acute Rehab PT Goals Patient Stated Goal: unable to state PT Goal Formulation: Patient unable to participate in goal setting Time For Goal Achievement: 11/14/19 Potential to Achieve Goals: Fair    Frequency    Min 3X/week      PT Plan      Co-evaluation              AM-PAC PT "6 Clicks" Mobility   Outcome Measure   Help needed turning from your back to your side while in a flat bed without using bedrails?: Total Help needed moving from lying on your back to sitting on the side of a flat bed without using bedrails?: Total Help needed moving to and from a bed to a chair (including a wheelchair)?: Total Help needed standing up from a chair using your arms (e.g., wheelchair or bedside chair)?: Total Help needed to walk in hospital room?: Total Help needed climbing 3-5 steps with a railing? : Total 6 Click Score: 6    End of Session Equipment Utilized During Treatment: Oxygen(10L HFNC) Activity Tolerance: Patient limited by lethargy Patient left: in bed;with call bell/phone within reach(no bed alarm in room)   PT Visit Diagnosis: Other abnormalities of gait and mobility (R26.89);Muscle weakness (generalized) (M62.81)     Time: 4315-4008 PT Time Calculation (min) (ACUTE ONLY): 29 min  Charges:  $Therapeutic Activity: 8-22 mins                      Veda Canning, PT Pager [phone]    Zena Amos 10/31/2019, 6:17 PM

## 2019-10-31 NOTE — Evaluation (Addendum)
Clinical/Bedside Swallow Evaluation Patient Details  Name: Isaac Murphy MRN: [id number] Date of Birth: Mar 25, 1986  Today's Date: 10/31/2019 Time: SLP Start Time (ACUTE ONLY): 1155 SLP Stop Time (ACUTE ONLY): 1212 SLP Time Calculation (min) (ACUTE ONLY): 17 min  Past Medical History:  Past Medical History:  Diagnosis Date  . Down syndrome   . H/O unilateral nephrectomy   . Seizures ([place]) 12/2014   last sz 03/18/16   Past Surgical History:  Past Surgical History:  Procedure Laterality Date  . ABDOMINAL SURGERY     HPI:  33 y.o. male with h/o Down syndrome, seizure disorder, on multiple meds, lives at home with his mother, brought to ED with lethargy and weakness. Dx with COVID-19 and transferred to [facility]; acute hypoxic respiratory failure, AKI   Assessment / Plan / Recommendation Clinical Impression  Pt participated in clinical swallow assessment.  He answered yes/no questions and was eager to eat. Oral mechanism exam revealed protruded tongue with open mouth posture, c/w Down Syndrome characteristics.  Pt unable to draw from straw, and had difficulty sealing lips around cup to minimize spilling. Once liquid passed into posterior oral cavity, he swallowed with no s/s of aspiration.  He was able to orally manipulate purees and ate a 4 oz container of applesauce without difficulty.  Phoned his mother, who reported that at baseline he feeds himself regular foods (chopped into small pieces by family) and drinks liquids from a water bottle with a small opening.  His favorite food is pizza, which he eats independently.  Recommend starting with a dysphagia 2 diet, thin liquids from bottle.  Crush meds in puree.  Pt will need assistance with meals.  SLP will follow briefly for safety/diet progression.  D/W RN.  SLP Visit Diagnosis: Dysphagia, oral phase (R13.11)    Aspiration Risk       Diet Recommendation   dys 2, thin liquids  Medication Administration: Crushed with puree    Other   Recommendations Oral Care Recommendations: Oral care BID   Follow up Recommendations None      Frequency and Duration min 2x/week  1 week       Prognosis        Swallow Study   General Date of Onset: 10/29/19 HPI: 33 y.o. male with h/o Down syndrome, seizure disorder, on multiple meds, lives at home with his mother, brought to ED with lethargy and weakness. Dx with COVID-19 and transferred to [facility]; acute hypoxic respiratory failure, AKI Type of Study: Bedside Swallow Evaluation Previous Swallow Assessment: no Diet Prior to this Study: NPO Temperature Spikes Noted: No Respiratory Status: Other (comment)(10 HFNC) History of Recent Intubation: No Behavior/Cognition: Alert Oral Cavity Assessment: Within Functional Limits Vision: Functional for self-feeding Self-Feeding Abilities: Needs assist Patient Positioning: Upright in bed Baseline Vocal Quality: Normal Volitional Cough: Cognitively unable to elicit Volitional Swallow: Unable to elicit    Oral/Motor/Sensory Function Overall Oral Motor/Sensory Function: Other (comment)(tongue protrusion, open mouth habit)   Ice Chips Ice chips: Not tested   Thin Liquid Thin Liquid: Impaired Presentation: Cup;Straw Oral Phase Impairments: Reduced labial seal Oral Phase Functional Implications: Right anterior spillage;Left anterior spillage    Nectar Thick Nectar Thick Liquid: Not tested   Honey Thick Honey Thick Liquid: Not tested   Puree Puree: Within functional limits Presentation: Spoon   Solid     Solid: Not tested      Juan Quam Laurice 10/31/2019,12:28 PM Estill Bamberg L. Tivis Ringer, [place] Office number [phone]

## 2019-10-31 NOTE — Progress Notes (Signed)
PROGRESS NOTE    Isaac Murphy  EHM:[id number] DOB: [birth year] DOA: 10/28/2019 PCP: Fleet Contras, MD    Brief Narrative:  33 year old gentleman with history of Down's syndrome, seizure disorders, behavioral abnormalities on multiple medications, lives at home with his mother was brought to the emergency room with lethargy, not eating well being more irritable since morning.  Patient was also having cough for 4 days. In the emergency room, he was hypoxic needing 8 L of oxygen, acute kidney injury, febrile. Diagnosed with COVID-19 pneumonia and acute kidney injury.   Assessment & Plan:   Principal Problem:   Acute respiratory failure with hypoxia (HCC) Active Problems:   COVID-19   Seizure disorder (HCC)  Pneumonia due to COVID-19 infection, acute hypoxemic respiratory failure: Continue to monitor due to significant symptoms , patient is still has significant oxygen requirement.  He developed more shortness of breath on 10/30/2019 with interstitial edema responded to diuretics. If able to do we will do chest physiotherapy, incentive spirometry, deep breathing exercises, sputum induction, difficult to participate. mucolytic's and bronchodilators. Supplemental oxygen to keep saturations more than 89 %. Covid directed therapy with , steroids, on dexamethasone. remdesivir, day 4/5. Convalescent plasma, 1 unit 10/30/2019 Actemra, suspected aspiration pneumonia, not given. Antibiotics to cover aspiration pneumonia with Unasyn. Due to severity of symptoms, patient will need daily inflammatory markers, liver function test to monitor and direct COVID-19 therapies.  Acute kidney injury with history of solitary kidney: Previous known kidney functions with creatinine of 1.17 in 2016. Presented with BUN 43/3.7-50/3.47, potassium 5.5. Evidence of acute kidney injury.  Renal ultrasound with no evidence of hydronephrosis.  Solitary right kidney.  Increased echogenicity suggesting probable  underlying chronic kidney disease. Fluctuating levels but remained fairly stable. Making adequate urine. Continue close monitoring.  Hypovolemia with hypernatremia: Not adequate oral intake. Encourage oral intake. Avoid iv fluids.   Suspected aspiration pneumonia: Seen by speech.  Will need supervised feeding.  Started on dysphagia 2 diet.  Will keep on Unasyn until clinical improvement.  Down syndrome with seizure disorders: On multiple medications at home.  Currently unable to take oral medications adequately.  Will continue on IV Keppra and Depakote.  Also on as needed benzodiazepines, will keep on as needed Ativan. Continue IV therapies today as patient does not have any reliable intake.  No IV access . Discussed with IV team and nephrology . Will get a midline IV line.   DVT prophylaxis: Lovenox subcu Code Status: Full code Family Communication: Patient's mother on the phone, Disposition Plan: Remains critically sick in the hospital.   Consultants:   None  Procedures:   None  Antimicrobials:   Rocephin, 10/28/2019>>>10/30/2019  Azithromycin, 10/28/2019>>10/30/2019  Remdesivir, 10/28/2019>>>  Unasyn, 10/30/2019>>>   Subjective: Patient seen and examined.  Overnight remained on high flow oxygen.  Mental status is somehow improved.  He was able to follow simple commands, try to answer questions.  He does not talk.  This is how he interacts at home.  Remains afebrile.  Objective: Vitals:   10/31/19 0500 10/31/19 0700 10/31/19 0748 10/31/19 1144  BP: (!) 135/91 (!) 128/101  (!) 146/118  Pulse:    (!) 117  Resp: 20 (!) 28  18  Temp:   99.2 F (37.3 C) (!) 97 F (36.1 C)  TempSrc:   Axillary   SpO2:  96%  98%  Weight:      Height:        Intake/Output Summary (Last 24 hours) at 10/31/2019 1344 Last data filed  at 10/31/2019 0500 Gross per 24 hour  Intake 978.92 ml  Output 1200 ml  Net -221.08 ml   Filed Weights   10/28/19 2315 10/29/19 0430  Weight: 74.5 kg  75 kg    Examination:  General exam: Appears comfortable on high flow oxygen.  Follows simple commands.  Incomprehensible speech at his baseline as per mother. Respiratory system: Clear to auscultation. Respiratory effort normal.  Mostly conducted airway sounds. Cardiovascular system: S1 & S2 heard, RRR. No JVD, murmurs, rubs, gallops or clicks. No pedal edema. Gastrointestinal system: Abdomen is nondistended, soft and nontender. No organomegaly or masses felt. Normal bowel sounds heard. Central nervous system: Sleepy but arousable.   Extremities: Symmetric 5 x 5 power. Skin: No rashes, lesions or ulcers Psychiatry: Judgement and insight appear compromised.       Data Reviewed: I have personally reviewed following labs and imaging studies  CBC: Recent Labs  Lab 10/28/19 2140 10/29/19 0645 10/30/19 0500 10/31/19 0028  WBC 14.1* 12.9* 11.4* 12.5*  NEUTROABS 10.6* 9.6* 8.2* 9.5*  HGB 12.7* 12.2* 12.3* 13.6  HCT 41.8 40.2 40.6 45.0  MCV 104.2* 103.6* 104.6* 104.7*  PLT 682* 639* 599* 376*   Basic Metabolic Panel: Recent Labs  Lab 10/28/19 2140 10/29/19 0645 10/30/19 0500 10/31/19 0028  NA 145 147* 151* 150*  K 5.3* 5.5* 4.7 5.3*  CL 109 107 109 109  CO2 22 23 27 27   GLUCOSE 146* 130* 94 106*  BUN 43* 50* 53* 55*  CREATININE 3.70* 3.46* 2.71* 2.53*  CALCIUM 8.6* 8.4* 9.0 9.5  MG  --   --   --  2.7*  PHOS  --   --   --  3.2   GFR: Estimated Creatinine Clearance: 36.7 mL/min (A) (by C-G formula based on SCr of 2.53 mg/dL (H)). Liver Function Tests: Recent Labs  Lab 10/28/19 2140 10/29/19 0645 10/30/19 0500 10/31/19 0028  AST 25 27 30  46*  ALT 16 20 23 31   ALKPHOS 91 95 93 92  BILITOT 0.4 0.3 0.4 0.7  PROT 7.6 7.8 7.9 8.6*  ALBUMIN 2.7* 2.8* 2.8* 3.2*   No results for input(s): LIPASE, AMYLASE in the last 168 hours. No results for input(s): AMMONIA in the last 168 hours. Coagulation Profile: No results for input(s): INR, PROTIME in the last 168  hours. Cardiac Enzymes: No results for input(s): CKTOTAL, CKMB, CKMBINDEX, TROPONINI in the last 168 hours. BNP (last 3 results) No results for input(s): PROBNP in the last 8760 hours. HbA1C: Recent Labs    10/30/19 0500  HGBA1C 6.6*   CBG: Recent Labs  Lab 10/30/19 1746 10/31/19 0059 10/31/19 0432 10/31/19 0749 10/31/19 1151  GLUCAP 104* 97 120* 111* 97   Lipid Profile: Recent Labs    10/28/19 2140  TRIG 411*   Thyroid Function Tests: Recent Labs    10/29/19 0645  TSH 5.463*   Anemia Panel: Recent Labs    10/28/19 2140  FERRITIN 255   Sepsis Labs: Recent Labs  Lab 10/28/19 2140 10/29/19 0031 10/31/19 0028  PROCALCITON 0.93  --  0.31  LATICACIDVEN 1.8 1.2  --     Recent Results (from the past 240 hour(s))  Blood Culture (routine x 2)     Status: None (Preliminary result)   Collection Time: 10/28/19 10:13 PM   Specimen: BLOOD  Result Value Ref Range Status   Specimen Description BLOOD RIGHT ANTECUBITAL  Final   Special Requests   Final    BOTTLES DRAWN AEROBIC AND ANAEROBIC Blood Culture  results may not be optimal due to an inadequate volume of blood received in culture bottles   Culture   Final    NO GROWTH 3 DAYS Performed at [facility], [address] [place], [place] [id number]    Report Status PENDING  Incomplete         Radiology Studies: DG CHEST PORT 1 VIEW  Result Date: 10/31/2019 CLINICAL DATA:  [id number] year old male with a history of pneumonia EXAM: PORTABLE CHEST 1 VIEW COMPARISON:  10/30/2019 FINDINGS: Cardiomediastinal silhouette unchanged in size and contour. Low lung volumes with patchy airspace/interstitial opacities bilaterally, unchanged. No pneumothorax. No large pleural effusion. Overlying EKG leads. IMPRESSION: Similar appearance of the chest x-ray with persisting bilateral mixed interstitial and airspace disease. Electronically Signed   By: Gilmer MorJaime  Wagner D.O.   On: 10/31/2019 11:41   DG CHEST PORT 1 VIEW  Result  Date: 10/30/2019 CLINICAL DATA:  Hypoxia. EXAM: PORTABLE CHEST 1 VIEW COMPARISON:  10/28/2019. FINDINGS: There are extensive bilateral airspace lung opacities, which appear more confluent in the bases than on the prior exam, now mostly obscuring the hemidiaphragms. No pneumothorax. IMPRESSION: 1. Worsened lung aeration when compared to the prior study with an interval increase in extensive bilateral airspace lung opacities, most evident at the bases. No other change. Electronically Signed   By: Amie Portlandavid  Ormond M.D.   On: 10/30/2019 14:10        Scheduled Meds: . dexamethasone (DECADRON) injection  6 mg Intravenous Q24H  . enoxaparin (LOVENOX) injection  40 mg Subcutaneous Q24H  . feeding supplement (PRO-STAT SUGAR FREE 64)  30 mL Oral BID  . insulin aspart  1 Units Subcutaneous TID WC  . sodium chloride flush  10-40 mL Intracatheter Q12H   Continuous Infusions: . ampicillin-sulbactam (UNASYN) IV 3 g (10/31/19 1331)  . levETIRAcetam 1,500 mg (10/31/19 1140)  . remdesivir 100 mg in NS 100 mL 100 mg (10/31/19 1214)  . valproate sodium 500 mg (10/31/19 0932)     LOS: 3 days    Time spent: 30 minutes    Dorcas CarrowKuber Esai Stecklein, MD [facility] Pager [phone]

## 2019-10-31 NOTE — Progress Notes (Signed)
RN facetime pt's mom. Pt was more alert today than yesterday. No further questions or concerns.

## 2019-10-31 NOTE — Progress Notes (Signed)
Called by PCU Charge RN to check on pt. Pt HR 120-130's. Pt tachypnic as well. PRN Metoprolol given without significant change. Pt seems to be slighting agitated/anxious. Recommended RN give pt PRN dose of ativan since pt is NPO.   Will continue to monitor patient.  Margaret Pyle, RN

## 2019-11-01 LAB — CBC WITH DIFFERENTIAL/PLATELET
Abs Immature Granulocytes: 0.2 10*3/uL — ABNORMAL HIGH (ref 0.00–0.07)
Band Neutrophils: 2 %
Basophils Absolute: 0 10*3/uL (ref 0.0–0.1)
Basophils Relative: 0 %
Eosinophils Absolute: 0 10*3/uL (ref 0.0–0.5)
Eosinophils Relative: 0 %
HCT: 43.8 % (ref 39.0–52.0)
Hemoglobin: 13.2 g/dL (ref 13.0–17.0)
Lymphocytes Relative: 8 %
Lymphs Abs: 0.8 10*3/uL (ref 0.7–4.0)
MCH: 31.9 pg (ref 26.0–34.0)
MCHC: 30.1 g/dL (ref 30.0–36.0)
MCV: 105.8 fL — ABNORMAL HIGH (ref 80.0–100.0)
Metamyelocytes Relative: 1 %
Monocytes Absolute: 0.8 10*3/uL (ref 0.1–1.0)
Monocytes Relative: 8 %
Myelocytes: 1 %
Neutro Abs: 7.9 10*3/uL — ABNORMAL HIGH (ref 1.7–7.7)
Neutrophils Relative %: 80 %
Platelets: 693 10*3/uL — ABNORMAL HIGH (ref 150–400)
RBC: 4.14 MIL/uL — ABNORMAL LOW (ref 4.22–5.81)
RDW: 16.8 % — ABNORMAL HIGH (ref 11.5–15.5)
WBC: 9.6 10*3/uL (ref 4.0–10.5)
nRBC: 1.1 % — ABNORMAL HIGH (ref 0.0–0.2)

## 2019-11-01 LAB — PROCALCITONIN: Procalcitonin: 0.16 ng/mL

## 2019-11-01 LAB — COMPREHENSIVE METABOLIC PANEL
ALT: 35 U/L (ref 0–44)
AST: 46 U/L — ABNORMAL HIGH (ref 15–41)
Albumin: 3.2 g/dL — ABNORMAL LOW (ref 3.5–5.0)
Alkaline Phosphatase: 89 U/L (ref 38–126)
Anion gap: 12 (ref 5–15)
BUN: 61 mg/dL — ABNORMAL HIGH (ref 6–20)
CO2: 28 mmol/L (ref 22–32)
Calcium: 9.1 mg/dL (ref 8.9–10.3)
Chloride: 116 mmol/L — ABNORMAL HIGH (ref 98–111)
Creatinine, Ser: 2.62 mg/dL — ABNORMAL HIGH (ref 0.61–1.24)
GFR calc Af Amer: 36 mL/min — ABNORMAL LOW (ref >60)
GFR calc non Af Amer: 31 mL/min — ABNORMAL LOW (ref >60)
Glucose, Bld: 100 mg/dL — ABNORMAL HIGH (ref 70–99)
Potassium: 5.5 mmol/L — ABNORMAL HIGH (ref 3.5–5.1)
Sodium: 156 mmol/L — ABNORMAL HIGH (ref 135–145)
Total Bilirubin: 0.7 mg/dL (ref 0.3–1.2)
Total Protein: 8.4 g/dL — ABNORMAL HIGH (ref 6.5–8.1)

## 2019-11-01 LAB — URINE CULTURE: Culture: NO GROWTH

## 2019-11-01 LAB — GLUCOSE, CAPILLARY
Glucose-Capillary: 123 mg/dL — ABNORMAL HIGH (ref 70–99)
Glucose-Capillary: 142 mg/dL — ABNORMAL HIGH (ref 70–99)
Glucose-Capillary: 98 mg/dL (ref 70–99)
Glucose-Capillary: 81 mg/dL (ref 70–99)
Glucose-Capillary: 102 mg/dL — ABNORMAL HIGH (ref 70–99)
Glucose-Capillary: 88 mg/dL (ref 70–99)

## 2019-11-01 LAB — PHOSPHORUS: Phosphorus: 4.4 mg/dL (ref 2.5–4.6)

## 2019-11-01 LAB — MAGNESIUM: Magnesium: 3 mg/dL — ABNORMAL HIGH (ref 1.7–2.4)

## 2019-11-01 MED ORDER — DIVALPROEX SODIUM 500 MG PO DR TAB
500.0000 mg | DELAYED_RELEASE_TABLET | Freq: Two times a day (BID) | ORAL | Status: DC
  Administered 2019-11-01 – 2019-11-09 (×17): 500 mg via ORAL
  Filled 2019-11-01 (×19): qty 1

## 2019-11-01 MED ORDER — SODIUM ZIRCONIUM CYCLOSILICATE 10 G PO PACK
10.0000 g | PACK | Freq: Once | ORAL | Status: AC
  Administered 2019-11-01: 10 g via ORAL
  Filled 2019-11-01: qty 1

## 2019-11-01 MED ORDER — LEVETIRACETAM 500 MG PO TABS
1500.0000 mg | ORAL_TABLET | Freq: Two times a day (BID) | ORAL | Status: DC
  Administered 2019-11-01 – 2019-11-09 (×17): 1500 mg via ORAL
  Filled 2019-11-01 (×17): qty 3

## 2019-11-01 MED ORDER — DEXTROSE 5 % IV SOLN
INTRAVENOUS | Status: DC
  Administered 2019-11-01 – 2019-11-02 (×2): via INTRAVENOUS

## 2019-11-01 NOTE — Plan of Care (Signed)
No acute events this shift. Pt resting comfortably. Hyperkalemia noted. Orders received. VS stable. X1 IV lopressor for HR control   Problem: Education: Goal: Knowledge of General Education information will improve Description: Including pain rating scale, medication(s)/side effects and non-pharmacologic comfort measures Outcome: Progressing   Problem: Health Behavior/Discharge Planning: Goal: Ability to manage health-related needs will improve Outcome: Progressing   Problem: Clinical Measurements: Goal: Ability to maintain clinical measurements within normal limits will improve Outcome: Progressing Goal: Will remain free from infection Outcome: Progressing Goal: Diagnostic test results will improve Outcome: Progressing Goal: Respiratory complications will improve Outcome: Progressing Goal: Cardiovascular complication will be avoided Outcome: Progressing   Problem: Activity: Goal: Risk for activity intolerance will decrease Outcome: Progressing   Problem: Nutrition: Goal: Adequate nutrition will be maintained Outcome: Progressing   Problem: Coping: Goal: Level of anxiety will decrease Outcome: Progressing   Problem: Elimination: Goal: Will not experience complications related to bowel motility Outcome: Progressing Goal: Will not experience complications related to urinary retention Outcome: Progressing   Problem: Pain Managment: Goal: General experience of comfort will improve Outcome: Progressing   Problem: Safety: Goal: Ability to remain free from injury will improve Outcome: Progressing   Problem: Skin Integrity: Goal: Risk for impaired skin integrity will decrease Outcome: Progressing   Problem: Education: Goal: Knowledge of risk factors and measures for prevention of condition will improve Outcome: Progressing   Problem: Coping: Goal: Psychosocial and spiritual needs will be supported Outcome: Progressing   Problem: Respiratory: Goal: Will maintain a  patent airway Outcome: Progressing Goal: Complications related to the disease process, condition or treatment will be avoided or minimized Outcome: Progressing

## 2019-11-01 NOTE — Progress Notes (Signed)
   11/01/19 0900  Family/Significant Other Communication  Family/Significant Other Update Called;Updated (mother)

## 2019-11-01 NOTE — Progress Notes (Signed)
PROGRESS NOTE    Isaac Murphy  ZOX:[id number] DOB: 10/15/1986 DOA: 10/28/2019 PCP: Fleet ContrasAvbuere, Edwin, MD    Brief Narrative:  33 year old gentleman with history of Down's syndrome, seizure disorders, behavioral abnormalities on multiple medications, lives at home with his mother was brought to the emergency room with lethargy, not eating well being more irritable since morning.  Patient was also having cough for 4 days. In the emergency room, he was hypoxic needing 8 L of oxygen, acute kidney injury, febrile. Diagnosed with COVID-19 pneumonia and acute kidney injury.   Assessment & Plan:   Principal Problem:   Acute respiratory failure with hypoxia (HCC) Active Problems:   COVID-19   Seizure disorder (HCC)  Pneumonia due to COVID-19 infection, acute hypoxemic respiratory failure: Continue to monitor due to significant symptoms , patient is still has significant oxygen requirement.  He developed more shortness of breath on 10/30/2019 with interstitial edema responded to diuretics. Today, however his oxygen requirements improving. If able to do we will do chest physiotherapy, incentive spirometry, deep breathing exercises, sputum induction, difficult to participate. mucolytic's and bronchodilators. Supplemental oxygen to keep saturations more than 89 %. Covid directed therapy with , steroids, on dexamethasone. remdesivir, day 5/5. Convalescent plasma, 1 unit 10/30/2019 Actemra, suspected aspiration pneumonia, not given. Antibiotics to cover aspiration pneumonia with Unasyn.  Continue on the 24 to 48 hours. Due to severity of symptoms, patient will need daily inflammatory markers, liver function test to monitor and direct COVID-19 therapies.  Acute kidney injury with history of solitary kidney: Previous known kidney functions with creatinine of 1.17 in 2016. Presented with BUN 43/3.7-50/3.47, potassium 5.5. Evidence of acute kidney injury.  Renal ultrasound with no evidence of  hydronephrosis.  Solitary right kidney.  Increased echogenicity suggesting probable underlying chronic kidney disease. Fluctuating levels but remained fairly stable. Making adequate urine. Continue close monitoring. Potassium 5.5, will give 1 dose of Lokelma today.  Continue close monitoring.  Hypovolemia with hypernatremia: Not adequate oral intake. Encourage oral intake.  Sodium is up trending, with his improvement in oxygenation, will start on gentle IV fluids with dextrose.  Intermittent Lasix to be used.  Suspected aspiration pneumonia: Seen by speech.  Will need supervised feeding.  Started on dysphagia 2 diet.  Will keep on Unasyn until clinical improvement.  Down syndrome with seizure disorders: On multiple medications at home.  Able to start eating today.  Will change to oral.  No IV access . Discussed with IV team and nephrology .  Received midline catheter.   DVT prophylaxis: Lovenox subcu Code Status: Full code Family Communication: Patient's mother on the phone, Disposition Plan: Remains critically sick in the hospital.   Consultants:   None  Procedures:   None  Antimicrobials:   Rocephin, 10/28/2019>>>10/30/2019  Azithromycin, 10/28/2019>>10/30/2019  Remdesivir, 10/28/2019>>>  Unasyn, 10/30/2019>>>   Subjective: Patient seen and examined.  No overnight events.  Oxygen requirements improved and currently on only 4 L.  Afebrile.  Making adequate urine.  Objective: Vitals:   10/31/19 2300 11/01/19 0500 11/01/19 0736 11/01/19 1135  BP: (!) 138/95 102/70 (!) 141/96 106/76  Pulse: (!) 109 (!) 120 (!) 109 (!) 114  Resp: (!) 23  17 (!) 22  Temp: 98.6 F (37 C) 98 F (36.7 C) 98.8 F (37.1 C) 98.5 F (36.9 C)  TempSrc: Axillary Axillary Oral Axillary  SpO2: 100% 100% 98% 100%  Weight:      Height:        Intake/Output Summary (Last 24 hours) at 11/01/2019 1319  Last data filed at 11/01/2019 0843 Gross per 24 hour  Intake 315.28 ml  Output 1500 ml  Net  -1184.72 ml   Filed Weights   10/28/19 2315 10/29/19 0430  Weight: 74.5 kg 75 kg    Examination:  General exam: Appears comfortable on 4 L oxygen.  Follows commands.  Incomprehensible speech at his baseline as per mother. Respiratory system: Clear to auscultation. Respiratory effort normal.  Mostly conducted airway sounds. Cardiovascular system: S1 & S2 heard, RRR. No JVD, murmurs, rubs, gallops or clicks. No pedal edema. Gastrointestinal system: Abdomen is nondistended, soft and nontender. No organomegaly or masses felt. Normal bowel sounds heard. Urostomy sinus at lower abdomen draining clear urine. Central nervous system: Alert but not oriented. Extremities: Symmetric 5 x 5 power.  Generalized weakness. Skin: No rashes, lesions or ulcers Psychiatry: Judgement and insight appear compromised.       Data Reviewed: I have personally reviewed following labs and imaging studies  CBC: Recent Labs  Lab 10/28/19 2140 10/29/19 0645 10/30/19 0500 10/31/19 0028 11/01/19 0900  WBC 14.1* 12.9* 11.4* 12.5* 9.6  NEUTROABS 10.6* 9.6* 8.2* 9.5* 7.9*  HGB 12.7* 12.2* 12.3* 13.6 13.2  HCT 41.8 40.2 40.6 45.0 43.8  MCV 104.2* 103.6* 104.6* 104.7* 105.8*  PLT 682* 639* 599* 668* 308*   Basic Metabolic Panel: Recent Labs  Lab 10/28/19 2140 10/29/19 0645 10/30/19 0500 10/31/19 0028 11/01/19 0900  NA 145 147* 151* 150* 156*  K 5.3* 5.5* 4.7 5.3* 5.5*  CL 109 107 109 109 116*  CO2 22 23 27 27 28   GLUCOSE 146* 130* 94 106* 100*  BUN 43* 50* 53* 55* 61*  CREATININE 3.70* 3.46* 2.71* 2.53* 2.62*  CALCIUM 8.6* 8.4* 9.0 9.5 9.1  MG  --   --   --  2.7* 3.0*  PHOS  --   --   --  3.2 4.4   GFR: Estimated Creatinine Clearance: 35.5 mL/min (A) (by C-G formula based on SCr of 2.62 mg/dL (H)). Liver Function Tests: Recent Labs  Lab 10/28/19 2140 10/29/19 0645 10/30/19 0500 10/31/19 0028 11/01/19 0900  AST 25 27 30  46* 46*  ALT 16 20 23 31  35  ALKPHOS 91 95 93 92 89  BILITOT 0.4 0.3  0.4 0.7 0.7  PROT 7.6 7.8 7.9 8.6* 8.4*  ALBUMIN 2.7* 2.8* 2.8* 3.2* 3.2*   No results for input(s): LIPASE, AMYLASE in the last 168 hours. No results for input(s): AMMONIA in the last 168 hours. Coagulation Profile: No results for input(s): INR, PROTIME in the last 168 hours. Cardiac Enzymes: No results for input(s): CKTOTAL, CKMB, CKMBINDEX, TROPONINI in the last 168 hours. BNP (last 3 results) No results for input(s): PROBNP in the last 8760 hours. HbA1C: Recent Labs    10/30/19 0500  HGBA1C 6.6*   CBG: Recent Labs  Lab 10/31/19 2027 11/01/19 0057 11/01/19 0356 11/01/19 0738 11/01/19 1135  GLUCAP 128* 142* 123* 98 81   Lipid Profile: No results for input(s): CHOL, HDL, LDLCALC, TRIG, CHOLHDL, LDLDIRECT in the last 72 hours. Thyroid Function Tests: No results for input(s): TSH, T4TOTAL, FREET4, T3FREE, THYROIDAB in the last 72 hours. Anemia Panel: No results for input(s): VITAMINB12, FOLATE, FERRITIN, TIBC, IRON, RETICCTPCT in the last 72 hours. Sepsis Labs: Recent Labs  Lab 10/28/19 2140 10/29/19 0031 10/31/19 0028  PROCALCITON 0.93  --  0.31  LATICACIDVEN 1.8 1.2  --     Recent Results (from the past 240 hour(s))  Blood Culture (routine x 2)  Status: None (Preliminary result)   Collection Time: 10/28/19 10:13 PM   Specimen: BLOOD  Result Value Ref Range Status   Specimen Description BLOOD RIGHT ANTECUBITAL  Final   Special Requests   Final    BOTTLES DRAWN AEROBIC AND ANAEROBIC Blood Culture results may not be optimal due to an inadequate volume of blood received in culture bottles   Culture   Final    NO GROWTH 4 DAYS Performed at [facility], [address], [place], [place] [address]    Report Status PENDING  Incomplete  Urine culture     Status: None   Collection Time: 10/30/19  8:48 PM   Specimen: Urine, Random  Result Value Ref Range Status   Specimen Description   Final    URINE, RANDOM Performed at [facility],  [address], [place], [place] [address]    Special Requests   Final    NONE Performed at [facility], [address], [place], [place] [address]    Culture   Final    NO GROWTH Performed at [facility], [address], [place], [place] [address]    Report Status 11/01/2019 FINAL  Final         Radiology Studies: DG CHEST PORT 1 VIEW  Result Date: 10/31/2019 CLINICAL DATA:  33 year old male with a history of pneumonia EXAM: PORTABLE CHEST 1 VIEW COMPARISON:  10/30/2019 FINDINGS: Cardiomediastinal silhouette unchanged in size and contour. Low lung volumes with patchy airspace/interstitial opacities bilaterally, unchanged. No pneumothorax. No large pleural effusion. Overlying EKG leads. IMPRESSION: Similar appearance of the chest x-ray with persisting bilateral mixed interstitial and airspace disease. Electronically Signed   By: Gilmer Mor D.O.   On: 10/31/2019 11:41   DG CHEST PORT 1 VIEW  Result Date: 10/30/2019 CLINICAL DATA:  Hypoxia. EXAM: PORTABLE CHEST 1 VIEW COMPARISON:  10/28/2019. FINDINGS: There are extensive bilateral airspace lung opacities, which appear more confluent in the bases than on the prior exam, now mostly obscuring the hemidiaphragms. No pneumothorax. IMPRESSION: 1. Worsened lung aeration when compared to the prior study with an interval increase in extensive bilateral airspace lung opacities, most evident at the bases. No other change. Electronically Signed   By: Amie Portland M.D.   On: 10/30/2019 14:10        Scheduled Meds: . dexamethasone (DECADRON) injection  6 mg Intravenous Q24H  . divalproex  500 mg Oral Q12H  . enoxaparin (LOVENOX) injection  40 mg Subcutaneous Q24H  . feeding supplement (PRO-STAT SUGAR FREE 64)  30 mL Oral BID  . insulin aspart  1 Units Subcutaneous TID WC  . levETIRAcetam  1,500 mg Oral BID  . metoprolol tartrate  12.5 mg Oral BID  . sodium chloride flush  10-40 mL Intracatheter Q12H  .  sodium zirconium cyclosilicate  10 g Oral Once   Continuous Infusions: . ampicillin-sulbactam (UNASYN) IV 3 g (11/01/19 1202)     LOS: 4 days    Time spent: 30 minutes    Dorcas Carrow, MD [facility] Pager [phone]

## 2019-11-02 LAB — PREPARE FRESH FROZEN PLASMA

## 2019-11-02 LAB — GLUCOSE, CAPILLARY
Glucose-Capillary: 105 mg/dL — ABNORMAL HIGH (ref 70–99)
Glucose-Capillary: 107 mg/dL — ABNORMAL HIGH (ref 70–99)
Glucose-Capillary: 112 mg/dL — ABNORMAL HIGH (ref 70–99)
Glucose-Capillary: 114 mg/dL — ABNORMAL HIGH (ref 70–99)
Glucose-Capillary: 153 mg/dL — ABNORMAL HIGH (ref 70–99)
Glucose-Capillary: 77 mg/dL (ref 70–99)

## 2019-11-02 LAB — CBC WITH DIFFERENTIAL/PLATELET
Abs Immature Granulocytes: 0.87 10*3/uL — ABNORMAL HIGH (ref 0.00–0.07)
Basophils Absolute: 0 10*3/uL (ref 0.0–0.1)
Basophils Relative: 0 %
Eosinophils Absolute: 0 10*3/uL (ref 0.0–0.5)
Eosinophils Relative: 0 %
HCT: 45.7 % (ref 39.0–52.0)
Hemoglobin: 13.4 g/dL (ref 13.0–17.0)
Immature Granulocytes: 9 %
Lymphocytes Relative: 9 %
Lymphs Abs: 0.9 10*3/uL (ref 0.7–4.0)
MCH: 31.4 pg (ref 26.0–34.0)
MCHC: 29.3 g/dL — ABNORMAL LOW (ref 30.0–36.0)
MCV: 107 fL — ABNORMAL HIGH (ref 80.0–100.0)
Monocytes Absolute: 0.7 10*3/uL (ref 0.1–1.0)
Monocytes Relative: 8 %
Neutro Abs: 7.3 10*3/uL (ref 1.7–7.7)
Neutrophils Relative %: 74 %
Platelets: 666 10*3/uL — ABNORMAL HIGH (ref 150–400)
RBC: 4.27 MIL/uL (ref 4.22–5.81)
RDW: 17 % — ABNORMAL HIGH (ref 11.5–15.5)
WBC: 9.8 10*3/uL (ref 4.0–10.5)
nRBC: 0.8 % — ABNORMAL HIGH (ref 0.0–0.2)

## 2019-11-02 LAB — COMPREHENSIVE METABOLIC PANEL
ALT: 43 U/L (ref 0–44)
AST: 41 U/L (ref 15–41)
Albumin: 3.3 g/dL — ABNORMAL LOW (ref 3.5–5.0)
Alkaline Phosphatase: 92 U/L (ref 38–126)
Anion gap: 13 (ref 5–15)
BUN: 61 mg/dL — ABNORMAL HIGH (ref 6–20)
CO2: 28 mmol/L (ref 22–32)
Calcium: 9.3 mg/dL (ref 8.9–10.3)
Chloride: 118 mmol/L — ABNORMAL HIGH (ref 98–111)
Creatinine, Ser: 2.65 mg/dL — ABNORMAL HIGH (ref 0.61–1.24)
GFR calc Af Amer: 36 mL/min — ABNORMAL LOW (ref >60)
GFR calc non Af Amer: 31 mL/min — ABNORMAL LOW (ref >60)
Glucose, Bld: 123 mg/dL — ABNORMAL HIGH (ref 70–99)
Potassium: 4.8 mmol/L (ref 3.5–5.1)
Sodium: 159 mmol/L — ABNORMAL HIGH (ref 135–145)
Total Bilirubin: 0.6 mg/dL (ref 0.3–1.2)
Total Protein: 8.2 g/dL — ABNORMAL HIGH (ref 6.5–8.1)

## 2019-11-02 LAB — BPAM FFP
Blood Product Expiration Date: 202012122250
ISSUE DATE / TIME: 202012112350
Unit Type and Rh: 6200

## 2019-11-02 LAB — CULTURE, BLOOD (ROUTINE X 2): Culture: NO GROWTH

## 2019-11-02 LAB — CYTOLOGY - NON PAP

## 2019-11-02 NOTE — Progress Notes (Signed)
Physical Therapy Treatment Patient Details Name: Isaac Murphy MRN: [id number] DOB: August 25, 1986 Today's Date: 11/02/2019    History of Present Illness 33 y.o. male with h/o Down syndrome, seizure disorder, on multiple meds, lives at home with his mother, brought to ED with lethargy and weakness. Dx with COVID-19 and transferred to [facility]; acute hypoxic respiratory failure, AKI     PT Comments    Patient much more alert and able to get OOB to [facility], however required +2 mod assist as pt very weak (and ?motivation). Patient smiling and laughing with therapist. Did not want to transfer back to bed and required +50mod-max assist BSC to bed. OT called and spoke with patient's mother to discuss home set-up and discharge plan. Unfortunately they have 6 steps to enter their home and pt currently too weak to walk. Discussed options and overall feel like home environment will be most successful for him. He may need medical transport home to get him into his house with HHPT. Currently recommending wheelchair for locomotion and will continue to monitor DME needs as he progresses.    Follow Up Recommendations  Home health PT;Supervision/Assistance - 24 hour(family and aid provide 24/7)     Equipment Recommendations  Wheelchair (measurements PT);Wheelchair cushion (measurements PT)    Recommendations for Other Services       Precautions / Restrictions Precautions Precautions: Fall    Mobility  Bed Mobility Overal bed mobility: Needs Assistance Bed Mobility: Rolling;Sidelying to Sit;Sit to Supine Rolling: Max assist;+2 for physical assistance Sidelying to sit: Max assist;+2 for physical assistance   Sit to supine: Mod assist   General bed mobility comments: pt awake/alert, however required max encouragement to particiopate  Transfers Overall transfer level: Needs assistance Equipment used: 2 person hand held assist Transfers: Sit to/from [employer] Transfers Sit to Stand: Mod  assist;+2 physical assistance;+2 safety/equipment Stand pivot transfers: Mod assist;+2 physical assistance;+2 safety/equipment;Max assist       General transfer comment: initial transfer pt more participatory; once seated on BSC he did not want to stand again; required max cues and +2 max assist to stand-pivot back to bed  Ambulation/Gait             General Gait Details: unable; on intial sit to stand, pt unsteady and legs weak, however began to initiate stepping with LLE and attempted to step forward with pt overall too weak and assisted to sit on [facility]   Stairs             Wheelchair Mobility    Modified Rankin (Stroke Patients Only)       Balance Overall balance assessment: Needs assistance(unable to assess  Simultaneous filing. User may not have seen previous data.) Sitting-balance support: No upper extremity supported;Feet supported Sitting balance-Leahy Scale: Poor Sitting balance - Comments: initial max assist due to posterior lean; progressed to min assist   Standing balance support: Bilateral upper extremity supported(although pt using UEs poorly to stabilize himself) Standing balance-Leahy Scale: Poor                              Cognition Arousal/Alertness: Awake/alert Behavior During Therapy: WFL for tasks assessed/performed;Flat affect Overall Cognitive Status: No family/caregiver present to determine baseline cognitive functioning                                 General Comments: responding to questions and most commands (  unintelligible mostly)      Exercises      General Comments General comments (skin integrity, edema, etc.): Noted laxity of joints and low tone state      Pertinent Vitals/Pain Pain Assessment: Faces Faces Pain Scale: No hurt    Home Living Family/patient expects to be discharged to:: Private residence Living Arrangements: Parent Available Help at Discharge: Family;Available 24 hours/day;Personal  care attendant(nine horus each day) Type of Home: House Home Access: Stairs to enter Entrance Stairs-Rails: Right;Left Home Layout: One level Home Equipment: None Additional Comments: Enjoys TV. Mom works for [employer]    Prior Function Level of Independence: Engineer, materials / Transfers Assistance Needed: walks ADL's / [employer] Assistance Needed: Aide and mom help with bathing and dressing, he will help with donning socks, shirt, and pants Comments: diapers mainly for urination   PT Goals (current goals can now be found in the care plan section) Acute Rehab PT Goals Patient Stated Goal: unable to state PT Goal Formulation: With family Time For Goal Achievement: 11/14/19 Potential to Achieve Goals: Fair Progress towards PT goals: Progressing toward goals(stair goal added)    Frequency    Min 3X/week      PT Plan Discharge plan needs to be updated    Co-evaluation PT/OT/SLP Co-Evaluation/Treatment: Yes Reason for Co-Treatment: Complexity of the patient's impairments (multi-system involvement);Necessary to address cognition/behavior during functional activity;For patient/therapist safety;To address functional/ADL transfers PT goals addressed during session: Mobility/safety with mobility;Balance        AM-PAC PT "6 Clicks" Mobility   Outcome Measure  Help needed turning from your back to your side while in a flat bed without using bedrails?: A Lot Help needed moving from lying on your back to sitting on the side of a flat bed without using bedrails?: A Lot Help needed moving to and from a bed to a chair (including a wheelchair)?: A Lot Help needed standing up from a chair using your arms (e.g., wheelchair or bedside chair)?: A Lot     6 Click Score: 8    End of Session Equipment Utilized During Treatment: Oxygen Activity Tolerance: Patient tolerated treatment well Patient left: in bed;with call bell/phone within reach(upright in chair position)    PT Visit Diagnosis: Other abnormalities of gait and mobility (R26.89);Muscle weakness (generalized) (M62.81)     Time: 1856-3149 PT Time Calculation (min) (ACUTE ONLY): 34 min  Charges:  $Neuromuscular Re-education: 8-22 mins                      Barry Brunner, PT Pager [phone]    Rexanne Mano 11/02/2019, 1:58 PM

## 2019-11-02 NOTE — Progress Notes (Signed)
Patient's mother was called and also video chat via Facetime. Mother was updated about current medical status and all questions asked  were answered.

## 2019-11-02 NOTE — Progress Notes (Signed)
PROGRESS NOTE    Isaac Murphy  JOA:[id number] DOB: 1986-05-09 DOA: 10/28/2019 PCP: Nolene Ebbs, MD    Brief Narrative:  33 year old gentleman with history of Down's syndrome, seizure disorders, behavioral abnormalities on multiple medications, lives at home with his mother was brought to the emergency room with lethargy, not eating well being more irritable since morning.  Patient was also having cough for 4 days. In the emergency room, he was hypoxic needing 8 L of oxygen, acute kidney injury, febrile. Diagnosed with COVID-19 pneumonia and acute kidney injury.   Assessment & Plan:   Principal Problem:   Acute respiratory failure with hypoxia (HCC) Active Problems:   COVID-19   Seizure disorder (HCC)  Pneumonia due to COVID-19 infection, acute hypoxemic respiratory failure: Pulmonary symptoms are improving.  Oxygen requirement improving and most of the time able to be on room air.   Unable to participate much in chest physiotherapy.  Continue with mucolytic's and bronchodilators. Supplemental oxygen to keep saturations more than 89 %. Covid directed therapy with , steroids, on dexamethasone. remdesivir, finished therapy. Convalescent plasma, 1 unit 10/30/2019 Antibiotics to cover aspiration pneumonia with Unasyn.  Continue until clinical improvement a total of 5 days. Implant markers are improving.  Now mostly with debility and hypernatremia.  Acute kidney injury with history of solitary kidney: Previous known kidney functions with creatinine of 1.17 in 2016. Presented with BUN 43/3.7-50/3.47, potassium 5.5. Evidence of acute kidney injury.  Renal ultrasound with no evidence of hydronephrosis.  Solitary right kidney.  Increased echogenicity suggesting probable underlying chronic kidney disease. Remains on gradual correction with 5% dextrose, potassium improved with Lokelma.  Will encourage oral liquids and free water.  Continue monitoring due to significant levels.  Will  aim for gradual correction.  Hypovolemia with hypernatremia: Not adequate oral intake. Encourage oral intake.  Sodium is up trending, with his improvement in oxygenation. Gradual correction with dextrose water.  Suspected aspiration pneumonia: Seen by speech.  Will need supervised feeding.  Started on dysphagia 2 diet.  Will keep on Unasyn until clinical improvement or total 5 days.  Down syndrome with seizure disorders: On multiple medications at home.  Changed to oral.   DVT prophylaxis: Lovenox subcu Code Status: Full code Family Communication: Patient's mother on the phone, Disposition Plan: Remains critically sick in the hospital.   Consultants:   None  Procedures:   None  Antimicrobials:   Rocephin, 10/28/2019>>>10/30/2019  Azithromycin, 10/28/2019>>10/30/2019  Remdesivir, 10/28/2019>>11/02/2019  Unasyn, 10/30/2019>>>   Subjective:  Patient seen and examined.  Mostly on room air.  Was able to interact more, however incomprehensible.  Afebrile.  Heart rate remains elevated.  Sodium is still high.  Objective: Vitals:   11/02/19 0900 11/02/19 1000 11/02/19 1011 11/02/19 1143  BP:    (!) 128/106  Pulse: (!) 127 (!) 102  (!) 118  Resp: (!) 24 20  16   Temp:   98.7 F (37.1 C) 99.5 F (37.5 C)  TempSrc:   Oral Oral  SpO2: (!) 80% 92%  100%  Weight:      Height:        Intake/Output Summary (Last 24 hours) at 11/02/2019 1206 Last data filed at 11/02/2019 0600 Gross per 24 hour  Intake 769.62 ml  Output --  Net 769.62 ml   Filed Weights   10/28/19 2315 10/29/19 0430  Weight: 74.5 kg 75 kg    Examination:  General exam: Appears comfortable on room air. Follows commands.  Incomprehensible speech at his baseline as per mother.  Respiratory system: Clear to auscultation. Respiratory effort normal.  No added sounds. Cardiovascular system: S1 & S2 heard, RRR. No JVD, murmurs, rubs, gallops or clicks. No pedal edema. Gastrointestinal system: Abdomen is  nondistended, soft and nontender. No organomegaly or masses felt. Normal bowel sounds heard. Urostomy sinus at lower abdomen draining clear urine. Central nervous system: Alert but not oriented. Extremities: Symmetric in all extremities.  Generalized weakness. Skin: No rashes, lesions or ulcers Psychiatry: Judgement and insight appear compromised.       Data Reviewed: I have personally reviewed following labs and imaging studies  CBC: Recent Labs  Lab 10/29/19 0645 10/30/19 0500 10/31/19 0028 11/01/19 0900 11/02/19 0634  WBC 12.9* 11.4* 12.5* 9.6 9.8  NEUTROABS 9.6* 8.2* 9.5* 7.9* 7.3  HGB 12.2* 12.3* 13.6 13.2 13.4  HCT 40.2 40.6 45.0 43.8 45.7  MCV 103.6* 104.6* 104.7* 105.8* 107.0*  PLT 639* 599* 668* 693* 666*   Basic Metabolic Panel: Recent Labs  Lab 10/29/19 0645 10/30/19 0500 10/31/19 0028 11/01/19 0900 11/02/19 0634  NA 147* 151* 150* 156* 159*  K 5.5* 4.7 5.3* 5.5* 4.8  CL 107 109 109 116* 118*  CO2 23 27 27 28 28   GLUCOSE 130* 94 106* 100* 123*  BUN 50* 53* 55* 61* 61*  CREATININE 3.46* 2.71* 2.53* 2.62* 2.65*  CALCIUM 8.4* 9.0 9.5 9.1 9.3  MG  --   --  2.7* 3.0*  --   PHOS  --   --  3.2 4.4  --    GFR: Estimated Creatinine Clearance: 35.1 mL/min (A) (by C-G formula based on SCr of 2.65 mg/dL (H)). Liver Function Tests: Recent Labs  Lab 10/29/19 0645 10/30/19 0500 10/31/19 0028 11/01/19 0900 11/02/19 0634  AST 27 30 46* 46* 41  ALT 20 23 31  35 43  ALKPHOS 95 93 92 89 92  BILITOT 0.3 0.4 0.7 0.7 0.6  PROT 7.8 7.9 8.6* 8.4* 8.2*  ALBUMIN 2.8* 2.8* 3.2* 3.2* 3.3*   No results for input(s): LIPASE, AMYLASE in the last 168 hours. No results for input(s): AMMONIA in the last 168 hours. Coagulation Profile: No results for input(s): INR, PROTIME in the last 168 hours. Cardiac Enzymes: No results for input(s): CKTOTAL, CKMB, CKMBINDEX, TROPONINI in the last 168 hours. BNP (last 3 results) No results for input(s): PROBNP in the last 8760  hours. HbA1C: No results for input(s): HGBA1C in the last 72 hours. CBG: Recent Labs  Lab 11/01/19 1615 11/01/19 2035 11/02/19 0015 11/02/19 0730 11/02/19 1141  GLUCAP 102* 88 153* 107* 77   Lipid Profile: No results for input(s): CHOL, HDL, LDLCALC, TRIG, CHOLHDL, LDLDIRECT in the last 72 hours. Thyroid Function Tests: No results for input(s): TSH, T4TOTAL, FREET4, T3FREE, THYROIDAB in the last 72 hours. Anemia Panel: No results for input(s): VITAMINB12, FOLATE, FERRITIN, TIBC, IRON, RETICCTPCT in the last 72 hours. Sepsis Labs: Recent Labs  Lab 10/28/19 2140 10/29/19 0031 10/31/19 0028 11/01/19 0900  PROCALCITON 0.93  --  0.31 0.16  LATICACIDVEN 1.8 1.2  --   --     Recent Results (from the past 240 hour(s))  Blood Culture (routine x 2)     Status: None   Collection Time: 10/28/19 10:13 PM   Specimen: BLOOD  Result Value Ref Range Status   Specimen Description BLOOD RIGHT ANTECUBITAL  Final   Special Requests   Final    BOTTLES DRAWN AEROBIC AND ANAEROBIC Blood Culture results may not be optimal due to an inadequate volume of blood received in culture  bottles   Culture   Final    NO GROWTH 5 DAYS Performed at [facility], [address] [place], [place] [address]    Report Status 11/02/2019 FINAL  Final  Urine culture     Status: None   Collection Time: 10/30/19  8:48 PM   Specimen: Urine, Random  Result Value Ref Range Status   Specimen Description   Final    URINE, RANDOM Performed at [facility], [address] [place], [place] [address]    Special Requests   Final    NONE Performed at [facility], [address] [place], [place] [address]    Culture   Final    NO GROWTH Performed at [facility], [address] [place], [place] [address]    Report Status 11/01/2019 FINAL  Final         Radiology Studies: No results found.      Scheduled Meds: . dexamethasone (DECADRON) injection  6  mg Intravenous Q24H  . divalproex  500 mg Oral Q12H  . enoxaparin (LOVENOX) injection  40 mg Subcutaneous Q24H  . feeding supplement (PRO-STAT SUGAR FREE 64)  30 mL Oral BID  . insulin aspart  1 Units Subcutaneous TID WC  . levETIRAcetam  1,500 mg Oral BID  . metoprolol tartrate  12.5 mg Oral BID  . sodium chloride flush  10-40 mL Intracatheter Q12H   Continuous Infusions: . ampicillin-sulbactam (UNASYN) IV 3 g (11/02/19 1113)  . dextrose 50 mL/hr at 11/02/19 1114     LOS: 5 days    Time spent: 30 minutes    Dorcas CarrowKuber Reeda Soohoo, MD [facility] Pager [phone]

## 2019-11-02 NOTE — Progress Notes (Signed)
Speech Pathology:  Pt upset, tearful, covering face with mitts and not wanting to eat/drink.  D/W his RN, who stated he had pulled out his leads and was upset when they had to be replaced. Devon reports pt doing well with POs thus far. SLP will f/u next date.  Kaeya Schiffer L. Tivis Ringer, [place] Office number [phone]

## 2019-11-02 NOTE — Evaluation (Signed)
Occupational Therapy Evaluation Patient Details Name: Isaac Murphy MRN: [id number] DOB: 06/01/1986 Today's Date: 11/02/2019    History of Present Illness 33 y.o. male with h/o Down syndrome, seizure disorder, on multiple meds, lives at home with his mother, brought to ED with lethargy and weakness. Dx with COVID-19 and [facility]; acute hypoxic respiratory failure, AKI    Clinical Impression   PTA, pt was living with his mother and has an aide who assist with BADLs and supervision; information collect from phone call with mother. Pt currently requiring Min A for UB ADLs, Max A for LB ADLs, and Mod-Max A +2 for functional transfer. Pt presenting with weakness, fatigue, and decreased balance impacting his safe performance of ADLs. Discussed with mother and feel home environment will be best for rehab and recovery; [facility]. Pt would benefit from further acute OT to facilitate safe dc. Recommend dc to home with HHOT for further OT to optimize safety, independence with ADLs, and return to PLOF.      Follow Up Recommendations  Home health OT;Supervision/Assistance - 24 hour    Equipment Recommendations  Tub/shower bench;Wheelchair (measurements OT);Wheelchair cushion (measurements OT)    Recommendations for Other Services PT consult     Precautions / Restrictions Precautions Precautions: Fall      Mobility Bed Mobility Overal bed mobility: Needs Assistance Bed Mobility: Rolling;Sidelying to Sit;Sit to Supine Rolling: Max assist;+2 for physical assistance Sidelying to sit: Max assist;+2 for physical assistance   Sit to supine: Mod assist   General bed mobility comments: pt awake/alert, however required max encouragement to particiopate  Transfers Overall transfer level: Needs assistance Equipment used: 2 person hand held assist Transfers: Sit to/from [employer];Stand Pivot Transfers Sit to Stand: Mod assist;+2 physical assistance;+2  safety/equipment Stand pivot transfers: Mod assist;+2 physical assistance;+2 safety/equipment;Max assist       General transfer comment: initial transfer pt more participatory; once seated on BSC he did not want to stand again; required max cues and +2 max assist to stand-pivot back to bed    Balance Overall balance assessment: Needs assistance(unable to assess ) Sitting-balance support: No upper extremity supported;Feet supported Sitting balance-Leahy Scale: Poor Sitting balance - Comments: initial max assist due to posterior lean; progressed to min assist   Standing balance support: Bilateral upper extremity supported(although pt using UEs poorly to stabilize himself) Standing balance-Leahy Scale: Poor                             ADL either performed or assessed with clinical judgement   ADL Overall ADL's : Needs assistance/impaired Eating/Feeding: Set up;Bed level   Grooming: Min guard;Sitting   Upper Body Bathing: Minimal assistance;Sitting   Lower Body Bathing: Maximal assistance;Sit to/from stand   Upper Body Dressing : Minimal assistance;Sitting   Lower Body Dressing: Maximal assistance;Sit to/from stand   Toilet Transfer: Moderate assistance;+2 for physical assistance;BSC;Stand-pivot;Maximal assistance Toilet Transfer Details (indicate cue type and reason): Pt with poor awarness and strength requiring Mod A +[facility].         Functional mobility during ADLs: Moderate assistance;+2 for physical assistance;Maximal assistance;+2 for safety/equipment General ADL Comments: Pt presenting with decreased strength and balance compared to baseline     Vision         Perception     Praxis      Pertinent Vitals/Pain Pain Assessment: Faces Faces Pain Scale: No hurt     Hand Dominance  Extremity/Trunk Assessment Upper Extremity Assessment Upper Extremity Assessment: Generalized weakness   Lower Extremity Assessment Lower Extremity  Assessment: Defer to PT evaluation       Communication Communication Communication: Expressive difficulties   Cognition Arousal/Alertness: Awake/alert Behavior During Therapy: WFL for tasks assessed/performed;Flat affect Overall Cognitive Status: No family/caregiver present to determine baseline cognitive functioning                                 General Comments: responding to questions and most commands (unintelligible mostly). Some moments clear like "hey baby" or "yes"   General Comments  Noted laxity of joints and low tone state. SpO2 stable on 3L.    Exercises     Shoulder Instructions      Home Living Family/patient expects to be discharged to:: Private residence Living Arrangements: Parent Available Help at Discharge: Family;Available 24 hours/day;Personal care attendant(nine horus each day) Type of Home: House Home Access: Stairs to enter [employer] of Steps: 6 Entrance Stairs-Rails: Right;Left Home Layout: One level     Bathroom Shower/Tub: Tub/shower unit;Door   [employer] Toilet: Standard     Home Equipment: None   Additional Comments: Information collected from mother via phone. Enjoys TV. Mom works for [employer]      Prior Functioning/Environment Level of Independence: Engineer, materials / Transfers Assistance Needed: Independent with mobility around home ADL's / Homemaking Assistance Needed: Aide and mom help with bathing and dressing, he will help with donning socks, shirt, and pants   Comments: diapers mainly for urination        OT Problem List: Decreased strength;Decreased range of motion;Decreased activity tolerance;Impaired balance (sitting and/or standing);Decreased safety awareness;Decreased knowledge of use of DME or AE;Decreased knowledge of precautions;Pain      OT Treatment/Interventions: Self-care/ADL training;Therapeutic exercise;Energy conservation;DME and/or AE instruction;Therapeutic  activities;Patient/family education    OT Goals(Current goals can be found in the care plan section) Acute Rehab OT Goals Patient Stated Goal: Mom "for him to get stronger and come home." OT Goal Formulation: With family Time For Goal Achievement: 11/16/19 Potential to Achieve Goals: Good  OT Frequency: Min 3X/week   Barriers to D/C:            Co-evaluation PT/OT/SLP Co-Evaluation/Treatment: Yes Reason for Co-Treatment: Complexity of the patient's impairments (multi-system involvement);For patient/therapist safety;To address functional/ADL transfers PT goals addressed during session: Mobility/safety with mobility;Balance OT goals addressed during session: ADL's and self-care      AM-PAC OT "6 Clicks" Daily Activity     Outcome Measure Help from another person eating meals?: A Little Help from another person taking care of personal grooming?: A Little Help from another person toileting, which includes using toliet, bedpan, or urinal?: A Lot Help from another person bathing (including washing, rinsing, drying)?: A Lot Help from another person to put on and taking off regular upper body clothing?: A Little Help from another person to put on and taking off regular lower body clothing?: A Lot 6 Click Score: 15   End of Session Equipment Utilized During Treatment: Rolling walker;Oxygen Nurse Communication: Mobility status  Activity Tolerance: Patient tolerated treatment well Patient left: in bed;with call bell/phone within reach;with bed alarm set;with restraints reapplied  OT Visit Diagnosis: Unsteadiness on feet (R26.81);Other abnormalities of gait and mobility (R26.89);Muscle weakness (generalized) (M62.81)                Time: 4098-1191 OT Time Calculation (min): 33 min Charges:  OT General Charges $OT Visit: 1 Visit OT Evaluation $OT Eval Moderate Complexity: 1 Mod  Isaac Murphy MSOT, OTR/L Acute Rehab Pager: [phone] Office: [phone]  Isaac Murphy  Isaac Murphy 11/02/2019, 3:29 PM

## 2019-11-03 LAB — GLUCOSE, CAPILLARY
Glucose-Capillary: 128 mg/dL — ABNORMAL HIGH (ref 70–99)
Glucose-Capillary: 161 mg/dL — ABNORMAL HIGH (ref 70–99)
Glucose-Capillary: 125 mg/dL — ABNORMAL HIGH (ref 70–99)
Glucose-Capillary: 144 mg/dL — ABNORMAL HIGH (ref 70–99)
Glucose-Capillary: 114 mg/dL — ABNORMAL HIGH (ref 70–99)

## 2019-11-03 LAB — BASIC METABOLIC PANEL WITH GFR
Anion gap: 12 (ref 5–15)
BUN: 62 mg/dL — ABNORMAL HIGH (ref 6–20)
CO2: 28 mmol/L (ref 22–32)
Calcium: 9.6 mg/dL (ref 8.9–10.3)
Chloride: 119 mmol/L — ABNORMAL HIGH (ref 98–111)
Creatinine, Ser: 2.84 mg/dL — ABNORMAL HIGH (ref 0.61–1.24)
GFR calc Af Amer: 33 mL/min — ABNORMAL LOW
GFR calc non Af Amer: 28 mL/min — ABNORMAL LOW
Glucose, Bld: 129 mg/dL — ABNORMAL HIGH (ref 70–99)
Potassium: 4.6 mmol/L (ref 3.5–5.1)
Sodium: 159 mmol/L — ABNORMAL HIGH (ref 135–145)

## 2019-11-03 MED ORDER — METOPROLOL TARTRATE 25 MG PO TABS
25.0000 mg | ORAL_TABLET | Freq: Two times a day (BID) | ORAL | Status: DC
  Administered 2019-11-03 – 2019-11-09 (×13): 25 mg via ORAL
  Filled 2019-11-03 (×13): qty 1

## 2019-11-03 MED ORDER — HALOPERIDOL LACTATE 5 MG/ML IJ SOLN
2.0000 mg | Freq: Once | INTRAMUSCULAR | Status: AC
  Administered 2019-11-03 – 2019-11-04 (×2): 2 mg via INTRAVENOUS
  Filled 2019-11-03: qty 1

## 2019-11-03 NOTE — Progress Notes (Signed)
Patient family called and updates as well as notified of restraints. Agrees with nessessity of restraints.

## 2019-11-03 NOTE — Progress Notes (Signed)
PROGRESS NOTE    Isaac Murphy  TFT:[id number] DOB: 1986/06/25 DOA: 10/28/2019 PCP: Nolene Ebbs, MD    Brief Narrative:  33 year old gentleman with history of Down's syndrome, seizure disorders, congenital suprapubic urethral opening, behavioral abnormalities on multiple medications, lives at home with his mother was brought to the emergency room with lethargy, not eating well being more irritable since morning.  Patient was also having cough for 4 days. In the emergency room, he was hypoxic needing 8 L of oxygen, acute kidney injury, febrile. Diagnosed with COVID-19 pneumonia and acute kidney injury.   Assessment & Plan:   Principal Problem:   Acute respiratory failure with hypoxia (HCC) Active Problems:   COVID-19   Seizure disorder (HCC)  Pneumonia due to COVID-19 infection, acute hypoxemic respiratory failure: Pulmonary symptoms are improving.  Oxygen requirement improving and most of the time able to be on room air.   Unable to participate much in chest physiotherapy. Continue with mucolytic's and bronchodilators. Supplemental oxygen to keep saturations more than 89 %. Covid directed therapy with , steroids, on dexamethasone. remdesivir, finished therapy. Convalescent plasma, 1 unit 10/30/2019 Antibiotics to cover aspiration pneumonia with Unasyn.  Continue until clinical improvement or a total of 5 days. Implant markers are improving.  Now mostly with debility and hypernatremia.  Acute kidney injury with history of solitary kidney: Previous known kidney functions with creatinine of 1.17 in 2016.  1.44 in 2019. Presented with BUN 43/3.7-50/3.47, potassium 5.5. Evidence of acute kidney injury.  Renal ultrasound with no evidence of hydronephrosis.  Solitary right kidney and congenital suprapubic opening. Increased echogenicity suggesting probable underlying chronic kidney disease. Remains on gradual correction with 5% dextrose, potassium improved with Lokelma.  Will  encourage oral liquids and free water.  Continue monitoring due to significant levels.  Will aim for gradual correction. His pulmonary status is improving, will increase dextrose to 61mL/h.  Continue monitoring. Mother is asking referral to nephrology on discharge.  Will do report on discharge.  Hypovolemia with hypernatremia: Not adequate oral intake. Encourage oral intake.  Sodium is up trending, with his improvement in oxygenation. Gradual correction with dextrose water.  Suspected aspiration pneumonia: Seen by speech.  Will need supervised feeding.  Started on dysphagia 2 diet.  Will keep on Unasyn until clinical improvement or total 5 days.  Down syndrome with seizure disorders: On multiple medications at home.  Changed to oral.  Agitation confusion: Due to underlying mental retardation and unfamiliar circumstances.  As needed Ativan and Haldol.  May need to use soft restraint to avoid pulling on lines, to protect his monitors as well as protect him from falling.  Sinus tachycardia: Continues to have tachycardia with no fever.  Will start patient on metoprolol and monitor.   DVT prophylaxis: Lovenox subcu Code Status: Full code Family Communication: Patient's mother on the phone, Disposition Plan: Remains critically sick in the hospital.   Consultants:   None  Procedures:   None  Antimicrobials:   Rocephin, 10/28/2019>>>10/30/2019  Azithromycin, 10/28/2019>>10/30/2019  Remdesivir, 10/28/2019>>11/02/2019  Unasyn, 10/30/2019>>>   Subjective:  Patient seen and examined.  No overnight events.  Occasionally on oxygen otherwise mostly on room air.  On my morning interview, patient was without any complaints.  He was trying to interact and looks comfortable.  Later in the afternoon, patient became more agitated and confused. Sodium 159, creatinine 2.84.  Objective: Vitals:   11/02/19 2300 11/03/19 0453 11/03/19 0758 11/03/19 1134  BP:  (!) 137/98 (!) 138/104 (!)  125/103  Pulse:  Marland Kitchen)  103 (!) 117 (!) 117  Resp: 20 20 17  (!) 22  Temp:  98.3 F (36.8 C) 98.2 F (36.8 C) 97.6 F (36.4 C)  TempSrc:  Axillary Axillary Axillary  SpO2:  100% 95% 97%  Weight:      Height:        Intake/Output Summary (Last 24 hours) at 11/03/2019 1304 Last data filed at 11/03/2019 0500 Gross per 24 hour  Intake 1972.28 ml  Output 400 ml  Net 1572.28 ml   Filed Weights   10/28/19 2315 10/29/19 0430  Weight: 74.5 kg 75 kg    Examination:  General exam: Appears comfortable on room air. Follows commands.  Incomprehensible speech at his baseline as per mother. Respiratory system: Clear to auscultation. Respiratory effort normal.  No added sounds. Cardiovascular system: S1 & S2 heard, RRR. No JVD, murmurs, rubs, gallops or clicks. No pedal edema. Gastrointestinal system: Abdomen is nondistended, soft and nontender. No organomegaly or masses felt. Normal bowel sounds heard. Urostomy sinus at lower abdomen draining clear urine at the pure wick. Central nervous system: Alert but not oriented. Extremities: Symmetric in all extremities.  Generalized weakness. Skin: No rashes, lesions or ulcers Psychiatry: Judgement and insight appear compromised.       Data Reviewed: I have personally reviewed following labs and imaging studies  CBC: Recent Labs  Lab 10/29/19 0645 10/30/19 0500 10/31/19 0028 11/01/19 0900 11/02/19 0634  WBC 12.9* 11.4* 12.5* 9.6 9.8  NEUTROABS 9.6* 8.2* 9.5* 7.9* 7.3  HGB 12.2* 12.3* 13.6 13.2 13.4  HCT 40.2 40.6 45.0 43.8 45.7  MCV 103.6* 104.6* 104.7* 105.8* 107.0*  PLT 639* 599* 668* 693* 666*   Basic Metabolic Panel: Recent Labs  Lab 10/30/19 0500 10/31/19 0028 11/01/19 0900 11/02/19 0634 11/03/19 1055  NA 151* 150* 156* 159* 159*  K 4.7 5.3* 5.5* 4.8 4.6  CL 109 109 116* 118* 119*  CO2 27 27 28 28 28   GLUCOSE 94 106* 100* 123* 129*  BUN 53* 55* 61* 61* 62*  CREATININE 2.71* 2.53* 2.62* 2.65* 2.84*  CALCIUM 9.0 9.5 9.1  9.3 9.6  MG  --  2.7* 3.0*  --   --   PHOS  --  3.2 4.4  --   --    GFR: Estimated Creatinine Clearance: 32.7 mL/min (A) (by C-G formula based on SCr of 2.84 mg/dL (H)). Liver Function Tests: Recent Labs  Lab 10/29/19 0645 10/30/19 0500 10/31/19 0028 11/01/19 0900 11/02/19 0634  AST 27 30 46* 46* 41  ALT 20 23 31  35 43  ALKPHOS 95 93 92 89 92  BILITOT 0.3 0.4 0.7 0.7 0.6  PROT 7.8 7.9 8.6* 8.4* 8.2*  ALBUMIN 2.8* 2.8* 3.2* 3.2* 3.3*   No results for input(s): LIPASE, AMYLASE in the last 168 hours. No results for input(s): AMMONIA in the last 168 hours. Coagulation Profile: No results for input(s): INR, PROTIME in the last 168 hours. Cardiac Enzymes: No results for input(s): CKTOTAL, CKMB, CKMBINDEX, TROPONINI in the last 168 hours. BNP (last 3 results) No results for input(s): PROBNP in the last 8760 hours. HbA1C: No results for input(s): HGBA1C in the last 72 hours. CBG: Recent Labs  Lab 11/02/19 1518 11/02/19 2220 11/03/19 0038 11/03/19 0357 11/03/19 0757  GLUCAP 112* 105* 128* 161* 125*   Lipid Profile: No results for input(s): CHOL, HDL, LDLCALC, TRIG, CHOLHDL, LDLDIRECT in the last 72 hours. Thyroid Function Tests: No results for input(s): TSH, T4TOTAL, FREET4, T3FREE, THYROIDAB in the last 72 hours. Anemia Panel: No  results for input(s): VITAMINB12, FOLATE, FERRITIN, TIBC, IRON, RETICCTPCT in the last 72 hours. Sepsis Labs: Recent Labs  Lab 10/28/19 2140 10/29/19 0031 10/31/19 0028 11/01/19 0900  PROCALCITON 0.93  --  0.31 0.16  LATICACIDVEN 1.8 1.2  --   --     Recent Results (from the past 240 hour(s))  Blood Culture (routine x 2)     Status: None   Collection Time: 10/28/19 10:13 PM   Specimen: BLOOD  Result Value Ref Range Status   Specimen Description BLOOD RIGHT ANTECUBITAL  Final   Special Requests   Final    BOTTLES DRAWN AEROBIC AND ANAEROBIC Blood Culture results may not be optimal due to an inadequate volume of blood received in  culture bottles   Culture   Final    NO GROWTH 5 DAYS Performed at [facility], [address] [place], [place] [address]    Report Status 11/02/2019 FINAL  Final  Urine culture     Status: None   Collection Time: 10/30/19  8:48 PM   Specimen: Urine, Random  Result Value Ref Range Status   Specimen Description   Final    URINE, RANDOM Performed at [facility], [address] [place], [place] [address]    Special Requests   Final    NONE Performed at [facility], [address] [place], [place] [address]    Culture   Final    NO GROWTH Performed at [facility], [address] [place], [place] [address]    Report Status 11/01/2019 FINAL  Final         Radiology Studies: No results found.      Scheduled Meds: . dexamethasone (DECADRON) injection  6 mg Intravenous Q24H  . divalproex  500 mg Oral Q12H  . enoxaparin (LOVENOX) injection  40 mg Subcutaneous Q24H  . feeding supplement (PRO-STAT SUGAR FREE 64)  30 mL Oral BID  . haloperidol lactate  2 mg Intravenous Once  . insulin aspart  1 Units Subcutaneous TID WC  . levETIRAcetam  1,500 mg Oral BID  . metoprolol tartrate  25 mg Oral BID  . sodium chloride flush  10-40 mL Intracatheter Q12H   Continuous Infusions: . ampicillin-sulbactam (UNASYN) IV 3 g (11/03/19 1155)  . dextrose 50 mL/hr at 11/03/19 0926     LOS: 6 days    Time spent: 30 minutes    Dorcas CarrowKuber Shanon Becvar, MD [facility] Pager [phone]

## 2019-11-03 NOTE — Progress Notes (Signed)
Physical Therapy Treatment Patient Details Name: Linville Decarolis MRN: [id number] DOB: 06/09/1986 Today's Date: 11/03/2019    History of Present Illness 33 y.o. male with h/o Down syndrome, seizure disorder, on multiple meds, lives at home with his mother, brought to ED with lethargy and weakness. Dx with COVID-19 and transferred to [facility]; acute hypoxic respiratory failure, AKI     PT Comments    Pt found in bed has mitts on and BUE restraints, nurse reports has been pulling at lines etc. Pt is agreeable to tx and for most part follows most directions given. Able to get to EOB with mod a, sat with BLE on floor and BUE supporting himself, he was quite uncomfortable attempting to move around and get up at times. Not sure if this was discomfort from something else but pt did have an episode of incontinence while sitting EOB. Pt was able to stand from EOB with max a and HHA and pivot to [facility], nurse in room to assist with clean up and reset of lines etc, while sitting on BSC sats poor but lines have all shifted or pulled off, placed pt on 2l/min via [place] (was previously on room air) once completed standing and clean up, with mod a and RW pt able to minimally pivot to bed with max a. Once in bed and repositioned noted 02 sats to be 100% pt returned to room air and sats remained in high 90s. Not sure of level of understanding with pt but seemed to follow most simple cues given during this session, at times needs stern cues.    Follow Up Recommendations  Home health PT;Supervision/Assistance - 24 hour     Equipment Recommendations  Wheelchair (measurements PT);Wheelchair cushion (measurements PT)    Recommendations for Other Services       Precautions / Restrictions Precautions Precautions: Fall Restrictions Weight Bearing Restrictions: No    Mobility  Bed Mobility Overal bed mobility: Needs Assistance Bed Mobility: Rolling;Supine to Sit;Sit to Supine Rolling: Mod assist   Supine to sit:  Max assist Sit to supine: Mod assist      Transfers Overall transfer level: Needs assistance Equipment used: Rolling walker (2 wheeled);1 person hand held assist Transfers: Sit to/from [facility] Sit to Stand: Mod assist Stand pivot transfers: Mod assist       General transfer comment: able to stand from bed side and stand pivot to [facility], initially with HHA and cues and on latter attempt with RW  Ambulation/Gait             General Gait Details: did not attempt ambulation at this time   Stairs             Wheelchair Mobility    Modified Rankin (Stroke Patients Only)       Balance Overall balance assessment: Needs assistance Sitting-balance support: Feet supported;Bilateral upper extremity supported Sitting balance-Leahy Scale: Poor Sitting balance - Comments: between fair and poor, able to sit w/o external support but is uncomfortable and squirming around, attempted to stand multi times but not strong enough to complete alone   Standing balance support: Bilateral upper extremity supported;During functional activity Standing balance-Leahy Scale: Poor                              Cognition Arousal/Alertness: Lethargic Behavior During Therapy: Agitated;Anxious;Restless Overall Cognitive Status: No family/caregiver present to determine baseline cognitive functioning  General Comments: pt had episode of incontinence and therapist asked it at home pt sits on commode, pt replied yes he does, able to understand some concepts       Exercises      General Comments        Pertinent Vitals/Pain Pain Assessment: Faces Faces Pain Scale: Hurts even more Pain Location: w/ sitting, not sure if position or attempting to have BM, but sitting EOb pt is very uncomfortable Pain Descriptors / Indicators: Discomfort;Grimacing Pain Intervention(s): Limited activity within patient's tolerance     Home Living                      Prior Function            PT Goals (current goals can now be found in the care plan section) Acute Rehab PT Goals Patient Stated Goal: pt unable to state goals Time For Goal Achievement: 11/14/19 Potential to Achieve Goals: Fair Progress towards PT goals: Progressing toward goals    Frequency    Min 3X/week      PT Plan Current plan remains appropriate    Co-evaluation              AM-PAC PT "6 Clicks" Mobility   Outcome Measure  Help needed turning from your back to your side while in a flat bed without using bedrails?: A Lot Help needed moving from lying on your back to sitting on the side of a flat bed without using bedrails?: A Lot Help needed moving to and from a bed to a chair (including a wheelchair)?: A Lot Help needed standing up from a chair using your arms (e.g., wheelchair or bedside chair)?: A Lot Help needed to walk in hospital room?: Total Help needed climbing 3-5 steps with a railing? : Total 6 Click Score: 10    End of Session Equipment Utilized During Treatment: Oxygen Activity Tolerance: Treatment limited secondary to agitation;Patient limited by fatigue;Patient limited by lethargy Patient left: in bed;with call bell/phone within reach Nurse Communication: Mobility status PT Visit Diagnosis: Other abnormalities of gait and mobility (R26.89);Muscle weakness (generalized) (M62.81)     Time: 8676-1950 PT Time Calculation (min) (ACUTE ONLY): 41 min  Charges:  $Therapeutic Activity: 38-52 mins                     Drema Pry, PT    Freddi Starr 11/03/2019, 4:44 PM

## 2019-11-03 NOTE — Progress Notes (Signed)
  Speech Language Pathology Treatment: Dysphagia  Patient Details Name: Isaac Murphy MRN: [id number] DOB: 1986/02/25 Today's Date: 11/03/2019 Time: 3810-1751 SLP Time Calculation (min) (ACUTE ONLY): 49 min  Assessment / Plan / Recommendation Clinical Impression  Pt encountered stripped of gown and all leads/mitts pulled off; nursing aware and present to reapply leads/02.  Pt has been repeatedly removing lines per chart review. Spent lengthy session with pt, including Facetiming his mother in order to collaborate with her about baseline eating.  Deon usually drinks liquids from a water/soda bottle. PTA, he could feed himself after food was cut and drink independently using both hands. Repositioned in bed, pt attempted to drink water from bottle with BUE.  He could not brink bottle to lips, and when he reached lips with physical support provided, most of liquids spilled from his mouth. Deon's mother encouraged via FT and affirmed that pt's tongue extension and lack of labial seal is a deterioration from baseline. Head tilt posteriorly helped bolus control marginally. Pt required gentle tactile and verbal cues to head tilt, lip seal.  A straw was attempted but there was a tendency to blow out rather than draw fluids in.    Issues with swallowing remaining primarily oral.  There are no concerns for a pharyngeal dysphagia. Will consult with OT to help manage feeding.   HPI HPI: 33 y.o. male with h/o Down syndrome, seizure disorder, on multiple meds, lives at home with his mother, brought to ED with lethargy and weakness. Dx with COVID-19 and transferred to [facility]; acute hypoxic respiratory failure, AKI      SLP Plan  Continue with current plan of care       Recommendations  Diet recommendations: Dysphagia 2 (fine chop);Thin liquid Liquids provided via: (water bottle) Medication Administration: Crushed with puree Supervision: Staff to assist with self feeding Postural Changes and/or Swallow  Maneuvers: Seated upright 90 degrees;Out of bed for meals                Oral Care Recommendations: Oral care BID Follow up Recommendations: None SLP Visit Diagnosis: Dysphagia, oral phase (R13.11) Plan: Continue with current plan of care       GO               Yarlin Breisch L. Tivis Ringer, [place] CCC/SLP Acute Rehabilitation Services Office number [phone] Pager [phone]  Juan Quam Laurice 11/03/2019, 1:11 PM

## 2019-11-03 NOTE — Progress Notes (Signed)
Pt management to get mitts off, pulled of leads and gown and purwick. Earlier in th shift pt had the same behaviors but also pulled of IV from right arm. Mitts were reapplied. New gown and leads put on pt.

## 2019-11-04 DIAGNOSIS — U071 COVID-19: Secondary | ICD-10-CM

## 2019-11-04 DIAGNOSIS — N179 Acute kidney failure, unspecified: Secondary | ICD-10-CM

## 2019-11-04 DIAGNOSIS — G40909 Epilepsy, unspecified, not intractable, without status epilepticus: Secondary | ICD-10-CM

## 2019-11-04 LAB — BASIC METABOLIC PANEL
Anion gap: 12 (ref 5–15)
BUN: 61 mg/dL — ABNORMAL HIGH (ref 6–20)
CO2: 27 mmol/L (ref 22–32)
Calcium: 9.5 mg/dL (ref 8.9–10.3)
Chloride: 118 mmol/L — ABNORMAL HIGH (ref 98–111)
Creatinine, Ser: 2.75 mg/dL — ABNORMAL HIGH (ref 0.61–1.24)
GFR calc Af Amer: 34 mL/min — ABNORMAL LOW (ref >60)
GFR calc non Af Amer: 29 mL/min — ABNORMAL LOW (ref >60)
Glucose, Bld: 129 mg/dL — ABNORMAL HIGH (ref 70–99)
Potassium: 5 mmol/L (ref 3.5–5.1)
Sodium: 157 mmol/L — ABNORMAL HIGH (ref 135–145)

## 2019-11-04 LAB — GLUCOSE, CAPILLARY
Glucose-Capillary: 108 mg/dL — ABNORMAL HIGH (ref 70–99)
Glucose-Capillary: 116 mg/dL — ABNORMAL HIGH (ref 70–99)
Glucose-Capillary: 123 mg/dL — ABNORMAL HIGH (ref 70–99)
Glucose-Capillary: 132 mg/dL — ABNORMAL HIGH (ref 70–99)
Glucose-Capillary: 155 mg/dL — ABNORMAL HIGH (ref 70–99)
Glucose-Capillary: 160 mg/dL — ABNORMAL HIGH (ref 70–99)
Glucose-Capillary: 181 mg/dL — ABNORMAL HIGH (ref 70–99)

## 2019-11-04 NOTE — Progress Notes (Addendum)
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 intake as well as a cough for several days.  He was febrile, hypoxic requiring 8 L, tested positive for Covid and was admitted to [facility] / 24h Interval events: Does not interact much on assessment this morning, he occasionally nods his head.  In restraints as he is pulling on his oxygen and telemetry leads  Assessment & Plan:  Principal Problem Acute Hypoxic Respiratory Failure due to Covid-19 Viral Illness/pneumonia -Patient has been treated with remdesivir and completed a 5-day course, currently on steroids, clinically improving and he is on room air -Status post convalescent plasma on 10/30/2019 -There was concern for aspiration pneumonia and he was started on Unasyn on 12/12, will complete a 7-day course, today day 5/7  Active Problems Acute kidney injury, history of solitary kidney -Previous creatinine was 1.44 in 2019.  On presentation his creatinine was 3.7 with hyperkalemia and potassium of 5.5. -Creatinine improving, 2.75 this morning, he is receiving IV fluids with D5 W  Hyperkalemia -Received Lokelma x1, potassium 5.0 this morning, closely monitored  Hypernatremia -Likely due to poor p.o. intake, his sodium was 159 yesterday, 157 this morning, gradually improving on dextrose water, continue fluids, discussed with bedside RN to encourage as much water drinking as possible today  Seizure disorder -Continue Depakote, Keppra  Suspected aspiration pneumonia -SLP evaluated, currently on dysphagia 2 diet with thin liquids  Agitation/confusion -Due to underlying Down syndrome, unfamiliar circumstances, possibly steroids  contributing -As needed Ativan and Haldol, currently in soft restraints  Sinus tachycardia -No fever, patient was started on metoprolol by Dr. Sloan Leiter, continue.  A degree of sinus tach is probably expected due to agitation as well as acute illness/dehydration   Scheduled Meds: . dexamethasone (DECADRON) injection  6 mg Intravenous Q24H  . divalproex  500 mg Oral Q12H  . enoxaparin (LOVENOX) injection  40 mg Subcutaneous Q24H  . feeding supplement (PRO-STAT SUGAR FREE 64)  30 mL Oral BID  . insulin aspart  1 Units Subcutaneous TID WC  . levETIRAcetam  1,500 mg Oral BID  . metoprolol tartrate  25 mg Oral BID  . sodium chloride flush  10-40 mL Intracatheter Q12H   Continuous Infusions: . ampicillin-sulbactam (UNASYN) IV 3 g (11/04/19 0409)  . dextrose 75 mL/hr at 11/04/19 0015   PRN Meds:.acetaminophen, clonazePAM, cyclobenzaprine, LORazepam, metoprolol tartrate, sodium chloride flush  DVT prophylaxis: Lovenox Code Status: Full code Family Communication: will call mother later Disposition Plan: Home when ready  Consultants:  None  Procedures:  None  Microbiology: Blood cultures 10/28/2019-no growth, final  Antimicrobials: Ceftriaxone/azithromycin 10/28/2019-10/30/2019 Unasyn 10/31/2019 >>  Objective: Vitals:   11/03/19 2038 11/03/19 2100 11/04/19 0400 11/04/19 0800  BP:  132/89 (!) 134/99 134/85  Pulse:   (!) 116 (!) 122  Resp: (!) 21  18 (!) 22  Temp:  98.1 F (36.7 C) 98.9 F (37.2 C)   TempSrc:  Oral Axillary   SpO2:   100% 100%  Weight:      Height:        Intake/Output Summary (Last 24 hours) at 11/04/2019 1120 Last data filed at 11/04/2019 1048 Gross per 24 hour  Intake 1861.19 ml  Output 200 ml  Net 1661.19 ml   [employer]  10/28/19 2315 10/29/19 0430  Weight: 74.5 kg 75 kg    Examination:  Constitutional: NAD, laying in bed Eyes: no scleral icterus ENMT: Very dry mucous membranes.  Neck: normal, supple Respiratory: clear to  auscultation bilaterally, no wheezing, no crackles. Normal respiratory effort.  Cardiovascular: Regular rate and rhythm, no murmurs / rubs / gallops. No LE edema.  Abdomen: non distended, no tenderness.  Pouch in place Musculoskeletal: no clubbing / cyanosis.  Skin: no rashes Neurologic: Moves all 4 independently, does not follow commands   Data Reviewed: I have independently reviewed following labs and imaging studies   CBC: Recent Labs  Lab 10/29/19 0645 10/30/19 0500 10/31/19 0028 11/01/19 0900 11/02/19 0634  WBC 12.9* 11.4* 12.5* 9.6 9.8  NEUTROABS 9.6* 8.2* 9.5* 7.9* 7.3  HGB 12.2* 12.3* 13.6 13.2 13.4  HCT 40.2 40.6 45.0 43.8 45.7  MCV 103.6* 104.6* 104.7* 105.8* 107.0*  PLT 639* 599* 668* 693* 666*   Basic Metabolic Panel: Recent Labs  Lab 10/31/19 0028 11/01/19 0900 11/02/19 0634 11/03/19 1055 11/04/19 0055  NA 150* 156* 159* 159* 157*  K 5.3* 5.5* 4.8 4.6 5.0  CL 109 116* 118* 119* 118*  CO2 27 28 28 28 27   GLUCOSE 106* 100* 123* 129* 129*  BUN 55* 61* 61* 62* 61*  CREATININE 2.53* 2.62* 2.65* 2.84* 2.75*  CALCIUM 9.5 9.1 9.3 9.6 9.5  MG 2.7* 3.0*  --   --   --   PHOS 3.2 4.4  --   --   --    GFR: Estimated Creatinine Clearance: 33.8 mL/min (A) (by C-G formula based on SCr of 2.75 mg/dL (H)). Liver Function Tests: Recent Labs  Lab 10/29/19 0645 10/30/19 0500 10/31/19 0028 11/01/19 0900 11/02/19 0634  AST 27 30 46* 46* 41  ALT 20 23 31  35 43  ALKPHOS 95 93 92 89 92  BILITOT 0.3 0.4 0.7 0.7 0.6  PROT 7.8 7.9 8.6* 8.4* 8.2*  ALBUMIN 2.8* 2.8* 3.2* 3.2* 3.3*   No results for input(s): LIPASE, AMYLASE in the last 168 hours. No results for input(s): AMMONIA in the last 168 hours. Coagulation Profile: No results for input(s): INR, PROTIME in the last 168 hours. Cardiac Enzymes: No results for input(s): CKTOTAL, CKMB, CKMBINDEX, TROPONINI in the last 168 hours. BNP (last 3 results) No results for input(s): PROBNP in the last 8760 hours. HbA1C: No  results for input(s): HGBA1C in the last 72 hours. CBG: Recent Labs  Lab 11/03/19 1645 11/03/19 2038 11/04/19 0043 11/04/19 0442 11/04/19 0809  GLUCAP 144* 114* 132* 155* 160*   Lipid Profile: No results for input(s): CHOL, HDL, LDLCALC, TRIG, CHOLHDL, LDLDIRECT in the last 72 hours. Thyroid Function Tests: No results for input(s): TSH, T4TOTAL, FREET4, T3FREE, THYROIDAB in the last 72 hours. Anemia Panel: No results for input(s): VITAMINB12, FOLATE, FERRITIN, TIBC, IRON, RETICCTPCT in the last 72 hours. Urine analysis:    Component Value Date/Time   COLORURINE STRAW (A) 10/30/2019 2043   APPEARANCEUR CLEAR 10/30/2019 2043   LABSPEC 1.011 10/30/2019 2043   PHURINE 5.0 10/30/2019 2043   GLUCOSEU NEGATIVE 10/30/2019 2043   HGBUR NEGATIVE 10/30/2019 2043   BILIRUBINUR NEGATIVE 10/30/2019 2043   KETONESUR NEGATIVE 10/30/2019 2043   PROTEINUR 100 (A) 10/30/2019 2043   UROBILINOGEN 0.2 11/10/2014 1301   NITRITE NEGATIVE 10/30/2019 2043   LEUKOCYTESUR NEGATIVE 10/30/2019 2043   Sepsis Labs: Invalid input(s): PROCALCITONIN, LACTICIDVEN  Recent Results (from the past 240 hour(s))  Blood Culture (routine x 2)  Status: None   Collection Time: 10/28/19 10:13 PM   Specimen: BLOOD  Result Value Ref Range Status   Specimen Description BLOOD RIGHT ANTECUBITAL  Final   Special Requests   Final    BOTTLES DRAWN AEROBIC AND ANAEROBIC Blood Culture results may not be optimal due to an inadequate volume of blood received in culture bottles   Culture   Final    NO GROWTH 5 DAYS Performed at [facility], [address] [place], [place] [address]    Report Status 11/02/2019 FINAL  Final  Urine culture     Status: None   Collection Time: 10/30/19  8:48 PM   Specimen: Urine, Random  Result Value Ref Range Status   Specimen Description   Final    URINE, RANDOM Performed at [facility], [address] [place], [place] [address]    Special Requests   Final      NONE Performed at [facility], [address] [place], [place] [address]    Culture   Final    NO GROWTH Performed at [facility], [address] [place], [place] [address]    Report Status 11/01/2019 FINAL  Final      Radiology Studies: No results found.  Isaac Pertostin [place] Inthavong, MD, PhD [facility]  Contact via  [email or web address]  [facility] Office Info P: [phone] F: [phone]

## 2019-11-05 LAB — BASIC METABOLIC PANEL
Anion gap: 13 (ref 5–15)
BUN: 67 mg/dL — ABNORMAL HIGH (ref 6–20)
CO2: 23 mmol/L (ref 22–32)
Calcium: 9.5 mg/dL (ref 8.9–10.3)
Chloride: 115 mmol/L — ABNORMAL HIGH (ref 98–111)
Creatinine, Ser: 2.9 mg/dL — ABNORMAL HIGH (ref 0.61–1.24)
GFR calc Af Amer: 32 mL/min — ABNORMAL LOW (ref >60)
GFR calc non Af Amer: 28 mL/min — ABNORMAL LOW (ref >60)
Glucose, Bld: 131 mg/dL — ABNORMAL HIGH (ref 70–99)
Potassium: 6.2 mmol/L — ABNORMAL HIGH (ref 3.5–5.1)
Sodium: 151 mmol/L — ABNORMAL HIGH (ref 135–145)

## 2019-11-05 LAB — GLUCOSE, CAPILLARY
Glucose-Capillary: 103 mg/dL — ABNORMAL HIGH (ref 70–99)
Glucose-Capillary: 113 mg/dL — ABNORMAL HIGH (ref 70–99)
Glucose-Capillary: 131 mg/dL — ABNORMAL HIGH (ref 70–99)
Glucose-Capillary: 150 mg/dL — ABNORMAL HIGH (ref 70–99)
Glucose-Capillary: 89 mg/dL (ref 70–99)

## 2019-11-05 LAB — CBC
HCT: 52.7 % — ABNORMAL HIGH (ref 39.0–52.0)
Hemoglobin: 16.2 g/dL (ref 13.0–17.0)
MCH: 32.3 pg (ref 26.0–34.0)
MCHC: 30.7 g/dL (ref 30.0–36.0)
MCV: 105 fL — ABNORMAL HIGH (ref 80.0–100.0)
Platelets: 575 10*3/uL — ABNORMAL HIGH (ref 150–400)
RBC: 5.02 MIL/uL (ref 4.22–5.81)
RDW: 16.8 % — ABNORMAL HIGH (ref 11.5–15.5)
WBC: 11 10*3/uL — ABNORMAL HIGH (ref 4.0–10.5)
nRBC: 0.3 % — ABNORMAL HIGH (ref 0.0–0.2)

## 2019-11-05 MED ORDER — METHYLPREDNISOLONE SODIUM SUCC 40 MG IJ SOLR
40.0000 mg | Freq: Two times a day (BID) | INTRAMUSCULAR | Status: DC
  Administered 2019-11-05 – 2019-11-06 (×2): 40 mg via INTRAVENOUS
  Filled 2019-11-05 (×2): qty 1

## 2019-11-05 MED ORDER — SODIUM ZIRCONIUM CYCLOSILICATE 10 G PO PACK
10.0000 g | PACK | Freq: Once | ORAL | Status: AC
  Administered 2019-11-05: 13:00:00 10 g via ORAL
  Filled 2019-11-05 (×2): qty 1

## 2019-11-05 MED ORDER — SODIUM CHLORIDE 0.9 % IV BOLUS
500.0000 mL | Freq: Once | INTRAVENOUS | Status: AC
  Administered 2019-11-05: 500 mL via INTRAVENOUS

## 2019-11-05 NOTE — Progress Notes (Signed)
Midline infiltrated on left arm. Fluids stopped and doctor on call paged. VAT team alerted and came up to remove midline once order was put in place and was able to get a peripheral IV on the right arm. Warm heat compress applied to patient's left arm. Pt was bathed and linens were changed. Pt is in no distress. VSS. Will continue to monitor.

## 2019-11-05 NOTE — Progress Notes (Signed)
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 intake as well as a cough for several days.  He was febrile, hypoxic requiring 8 L, tested positive for Covid and was admitted to [facility] / 24h Interval events: Minimally interactive, he appears alert however does not talk or answer questions  Assessment & Plan:  Principal Problem Acute Hypoxic Respiratory Failure due to Covid-19 Viral Illness/pneumonia -Patient has been treated with remdesivir and completed a 5-day course, currently on steroids, clinically improving and he is on room air -Status post convalescent plasma on 10/30/2019 -There was concern for aspiration pneumonia and he was started on Unasyn on 12/12, will complete a 7-day course, today day 6/7  Active Problems Acute kidney injury, history of solitary kidney -Previous creatinine was 1.44 in 2019.  On presentation his creatinine was 3.7 with hyperkalemia and potassium of 5.5. -Patient was placed on IV fluids, his creatinine has been improving and yesterday was 2.7, however today it appears to be getting slightly worse 2.9.  Unable to measure accurate output -patient had an ultrasound of the kidney on 12/9 which showed mild right-sided hydronephrosis.  We will repeat renal ultrasound, if hydronephrosis is persistent or worse will need urology input.  I will also bolus him 500 cc today.  Clinically appears euvolemic -Case was discussed with Dr. Joelyn Oms of neurology  Hyperkalemia -Persistently hyperkalemic with potassium greater than 6 today, will repeat Lokelma -Change steroids from Decadron to Solu-Medrol as dexamethasone can cause hyperkalemia in rare instances, however I  suspect his elevated potassium is due to renal failure  Hypernatremia -Sodium gradually improving, 151 this morning, seems to responding to DW.  Continue  Seizure disorder -Continue Depakote, Keppra  Suspected aspiration pneumonia -SLP evaluated, currently on dysphagia 2 diet with thin liquids  Agitation/confusion -Due to underlying Down syndrome, unfamiliar circumstances, possibly steroids contributing -As needed Ativan and Haldol, currently in soft restraints  Sinus tachycardia -No fever, patient was started on metoprolol by Dr. Sloan Leiter, continue.  A degree of sinus tach is probably expected due to agitation as well as acute illness/dehydration   Scheduled Meds: . divalproex  500 mg Oral Q12H  . enoxaparin (LOVENOX) injection  40 mg Subcutaneous Q24H  . feeding supplement (PRO-STAT SUGAR FREE 64)  30 mL Oral BID  . insulin aspart  1 Units Subcutaneous TID WC  . levETIRAcetam  1,500 mg Oral BID  . methylPREDNISolone (SOLU-MEDROL) injection  40 mg Intravenous Q12H  . metoprolol tartrate  25 mg Oral BID  . sodium chloride flush  10-40 mL Intracatheter Q12H   Continuous Infusions: . ampicillin-sulbactam (UNASYN) IV 3 g (11/05/19 1250)  . dextrose 100 mL/hr at 11/05/19 0927   PRN Meds:.acetaminophen, clonazePAM, cyclobenzaprine, LORazepam, metoprolol tartrate, sodium chloride flush  DVT prophylaxis: Lovenox Code Status: Full code Family Communication: will call mother later Disposition Plan: Home when ready  Consultants:  None  Procedures:  None  Microbiology: Blood cultures 10/28/2019-no growth, final  Antimicrobials: Ceftriaxone/azithromycin 10/28/2019-10/30/2019 Unasyn 10/31/2019 >>  Objective: Vitals:   11/04/19 1151 11/04/19 2000 11/05/19 0803 11/05/19 1100  BP: 115/90 (!) 129/96 128/88   Pulse: (!) 115 (!) 130 (!) 126 (!) 110  Resp: 20  18 20   Temp:  (!) 97.5 F (36.4 C) 97.6 F (36.4 C) (!)  97.4 F (36.3 C)  TempSrc:  Oral Axillary Axillary  SpO2: 90%  96% 100% 100%  Weight:      Height:        Intake/Output Summary (Last 24 hours) at 11/05/2019 1426 Last data filed at 11/05/2019 1256 Gross per 24 hour  Intake 100 ml  Output 1 ml  Net 99 ml   [employer]   10/28/19 2315 10/29/19 0430  Weight: 74.5 kg 75 kg    Examination:  Constitutional: No distress, in bed Eyes: No scleral icterus ENMT: Dry mucous membranes Neck: normal, supple Respiratory: Clear bilaterally without wheezing or crackles, normal respiratory effort Cardiovascular: Regular rate and rhythm, no murmurs, no peripheral edema Abdomen: Soft, nondistended, nontender Musculoskeletal: no clubbing / cyanosis.  Skin: No rashes seen Neurologic: Moves all 4 extremities independently, not following commands consistently   Data Reviewed: I have independently reviewed following labs and imaging studies   CBC: Recent Labs  Lab 10/30/19 0500 10/31/19 0028 11/01/19 0900 11/02/19 0634 11/05/19 0725  WBC 11.4* 12.5* 9.6 9.8 11.0*  NEUTROABS 8.2* 9.5* 7.9* 7.3  --   HGB 12.3* 13.6 13.2 13.4 16.2  HCT 40.6 45.0 43.8 45.7 52.7*  MCV 104.6* 104.7* 105.8* 107.0* 105.0*  PLT 599* 668* 693* 666* 575*   Basic Metabolic Panel: Recent Labs  Lab 10/31/19 0028 11/01/19 0900 11/02/19 0634 11/03/19 1055 11/04/19 0055 11/05/19 0725  NA 150* 156* 159* 159* 157* 151*  K 5.3* 5.5* 4.8 4.6 5.0 6.2*  CL 109 116* 118* 119* 118* 115*  CO2 27 28 28 28 27 23   GLUCOSE 106* 100* 123* 129* 129* 131*  BUN 55* 61* 61* 62* 61* 67*  CREATININE 2.53* 2.62* 2.65* 2.84* 2.75* 2.90*  CALCIUM 9.5 9.1 9.3 9.6 9.5 9.5  MG 2.7* 3.0*  --   --   --   --   PHOS 3.2 4.4  --   --   --   --    GFR: Estimated Creatinine Clearance: 32.1 mL/min (A) (by C-G formula based on SCr of 2.9 mg/dL (H)). Liver Function Tests: Recent Labs  Lab 10/30/19 0500 10/31/19 0028 11/01/19 0900 11/02/19 0634  AST 30 46* 46* 41  ALT 23 31 35 43  ALKPHOS 93 92 89 92  BILITOT 0.4 0.7 0.7 0.6  PROT 7.9 8.6*  8.4* 8.2*  ALBUMIN 2.8* 3.2* 3.2* 3.3*   No results for input(s): LIPASE, AMYLASE in the last 168 hours. No results for input(s): AMMONIA in the last 168 hours. Coagulation Profile: No results for input(s): INR, PROTIME in the last 168 hours. Cardiac Enzymes: No results for input(s): CKTOTAL, CKMB, CKMBINDEX, TROPONINI in the last 168 hours. BNP (last 3 results) No results for input(s): PROBNP in the last 8760 hours. HbA1C: No results for input(s): HGBA1C in the last 72 hours. CBG: Recent Labs  Lab 11/04/19 1628 11/04/19 2039 11/05/19 0040 11/05/19 0806 11/05/19 1121  GLUCAP 116* 181* 150* 131* 103*   Lipid Profile: No results for input(s): CHOL, HDL, LDLCALC, TRIG, CHOLHDL, LDLDIRECT in the last 72 hours. Thyroid Function Tests: No results for input(s): TSH, T4TOTAL, FREET4, T3FREE, THYROIDAB in the last 72 hours. Anemia Panel: No results for input(s): VITAMINB12, FOLATE, FERRITIN, TIBC, IRON, RETICCTPCT in the last 72 hours. Urine analysis:    Component Value Date/Time   COLORURINE STRAW (A) 10/30/2019 2043   APPEARANCEUR CLEAR 10/30/2019 2043   LABSPEC 1.011 10/30/2019 2043   PHURINE 5.0 10/30/2019 2043   GLUCOSEU NEGATIVE 10/30/2019 2043   HGBUR NEGATIVE 10/30/2019  2043   BILIRUBINUR NEGATIVE 10/30/2019 2043   KETONESUR NEGATIVE 10/30/2019 2043   PROTEINUR 100 (A) 10/30/2019 2043   UROBILINOGEN 0.2 11/10/2014 1301   NITRITE NEGATIVE 10/30/2019 2043   LEUKOCYTESUR NEGATIVE 10/30/2019 2043   Sepsis Labs: Invalid input(s): PROCALCITONIN, LACTICIDVEN  Recent Results (from the past 240 hour(s))  Blood Culture (routine x 2)     Status: None   Collection Time: 10/28/19 10:13 PM   Specimen: BLOOD  Result Value Ref Range Status   Specimen Description BLOOD RIGHT ANTECUBITAL  Final   Special Requests   Final    BOTTLES DRAWN AEROBIC AND ANAEROBIC Blood Culture results may not be optimal due to an inadequate volume of blood received in culture bottles   Culture    Final    NO GROWTH 5 DAYS Performed at [facility], [address] [place], [place] [address]    Report Status 11/02/2019 FINAL  Final  Urine culture     Status: None   Collection Time: 10/30/19  8:48 PM   Specimen: Urine, Random  Result Value Ref Range Status   Specimen Description   Final    URINE, RANDOM Performed at [facility], [address] [place], [place] [address]    Special Requests   Final    NONE Performed at [facility], [address] [place], [place] [address]    Culture   Final    NO GROWTH Performed at [facility], [address] [place], [place] [address]    Report Status 11/01/2019 FINAL  Final      Radiology Studies: No results found.  Pamella Pertostin Makari Portman, MD, PhD [facility]  Contact via  [email or web address]  [facility] Office Info P: [phone] F: [phone]

## 2019-11-05 NOTE — Progress Notes (Signed)
Physical Therapy Treatment Patient Details Name: Isaac Murphy MRN: [id number] DOB: [birth year] Today's Date: 11/05/2019    History of Present Illness 33 y.o. male with h/o Down syndrome, seizure disorder, on multiple meds, lives at home with his mother, brought to ED with lethargy and weakness. Dx with COVID-19 and transferred to [facility]; acute hypoxic respiratory failure, AKI     PT Comments    Patient much more alert and following simple instructions (when he wants to vs understands). Stood twice at EOB with min assist and pt holding bed rail. Will need +2 (or +3 to follow with a chair) to ambulate safely due to his cognition. He did walk independently at home PTA.    Follow Up Recommendations  Home health PT;Supervision/Assistance - 24 hour     Equipment Recommendations  Wheelchair (measurements PT);Wheelchair cushion (measurements PT)(18" wide, 16" deep, hemi-height (pt is short))    Recommendations for Other Services       Precautions / Restrictions Precautions Precautions: Fall Restrictions Weight Bearing Restrictions: No    Mobility  Bed Mobility Overal bed mobility: Needs Assistance Bed Mobility: Supine to Sit;Sit to Supine     Supine to sit: Min assist Sit to supine: Min guard   General bed mobility comments: pt following instructions and gestures much better, came to sit EOB using rail and only needed assist to scoot out for feet to reach the floor; crawled back into bed with only close-guarding  Transfers Overall transfer level: Needs assistance Equipment used: 1 person hand held assist Transfers: Sit to/from Stand Sit to Stand: Min assist         General transfer comment: stood from EOB with min assist twice; noted had had a BM but he would not pivot to bedside commode; called for assist to try to have him stand for clean-up but pt would not stay sitting while waiting (trying to lie back down);   Ambulation/Gait             General Gait  Details: did not attempt ambulation at this time; will need +2   Stairs             Wheelchair Mobility    Modified Rankin (Stroke Patients Only)       Balance Overall balance assessment: Needs assistance Sitting-balance support: Bilateral upper extremity supported;Feet unsupported Sitting balance-Leahy Scale: Poor Sitting balance - Comments: feet not reaching the floor pt using bil UEs for support but needed no additional assist   Standing balance support: Bilateral upper extremity supported;During functional activity Standing balance-Leahy Scale: Poor Standing balance comment: posterior lean initially, holding bed rail and PTs hand                            Cognition Arousal/Alertness: Awake/alert Behavior During Therapy: Restless Overall Cognitive Status: No family/caregiver present to determine baseline cognitive functioning                                 General Comments: pt responding with single words (often intelligible--hey!, yea, no)      Exercises Other Exercises Other Exercises: attempted supine and sitting exercises however pt would not repeat motions after completed PROM or with visual/verbal cues    General Comments General comments (skin integrity, edema, etc.): Much more alert and participatory; due to weakness and decr cognition not yet safe to ambulate with 1 person assist  Pertinent Vitals/Pain Pain Assessment: Faces Faces Pain Scale: No hurt    Home Living                      Prior Function            PT Goals (current goals can now be found in the care plan section) Acute Rehab PT Goals Patient Stated Goal: pt unable to state goals Time For Goal Achievement: 11/14/19 Potential to Achieve Goals: Fair Progress towards PT goals: Progressing toward goals    Frequency    Min 3X/week      PT Plan Current plan remains appropriate    Co-evaluation              AM-PAC PT "6 Clicks"  Mobility   Outcome Measure  Help needed turning from your back to your side while in a flat bed without using bedrails?: A Little Help needed moving from lying on your back to sitting on the side of a flat bed without using bedrails?: A Little Help needed moving to and from a bed to a chair (including a wheelchair)?: A Lot Help needed standing up from a chair using your arms (e.g., wheelchair or bedside chair)?: A Little Help needed to walk in hospital room?: Total Help needed climbing 3-5 steps with a railing? : Total 6 Click Score: 13    End of Session   Activity Tolerance: Patient limited by fatigue Patient left: in bed;with call bell/phone within reach;with bed alarm set;with restraints reapplied(bil wrist restraints) Nurse Communication: Mobility status;Other (comment)(needs assist post-BM) PT Visit Diagnosis: Other abnormalities of gait and mobility (R26.89);Muscle weakness (generalized) (M62.81)     Time: 7353-2992 PT Time Calculation (min) (ACUTE ONLY): 30 min  Charges:  $Therapeutic Activity: 23-37 mins                      Arby Barrette, PT Pager [phone]    Rexanne Mano 11/05/2019, 11:06 AM

## 2019-11-06 ENCOUNTER — Inpatient Hospital Stay (HOSPITAL_COMMUNITY): Payer: Medicaid Other

## 2019-11-06 LAB — COMPREHENSIVE METABOLIC PANEL
ALT: 75 U/L — ABNORMAL HIGH (ref 0–44)
AST: 31 U/L (ref 15–41)
Albumin: 3.1 g/dL — ABNORMAL LOW (ref 3.5–5.0)
Alkaline Phosphatase: 98 U/L (ref 38–126)
Anion gap: 12 (ref 5–15)
BUN: 62 mg/dL — ABNORMAL HIGH (ref 6–20)
CO2: 20 mmol/L — ABNORMAL LOW (ref 22–32)
Calcium: 9.2 mg/dL (ref 8.9–10.3)
Chloride: 115 mmol/L — ABNORMAL HIGH (ref 98–111)
Creatinine, Ser: 2.76 mg/dL — ABNORMAL HIGH (ref 0.61–1.24)
GFR calc Af Amer: 34 mL/min — ABNORMAL LOW (ref >60)
GFR calc non Af Amer: 29 mL/min — ABNORMAL LOW (ref >60)
Glucose, Bld: 108 mg/dL — ABNORMAL HIGH (ref 70–99)
Potassium: 5.4 mmol/L — ABNORMAL HIGH (ref 3.5–5.1)
Sodium: 147 mmol/L — ABNORMAL HIGH (ref 135–145)
Total Bilirubin: 0.9 mg/dL (ref 0.3–1.2)
Total Protein: 7.5 g/dL (ref 6.5–8.1)

## 2019-11-06 LAB — CBC
HCT: 45.4 % (ref 39.0–52.0)
Hemoglobin: 14 g/dL (ref 13.0–17.0)
MCH: 31.8 pg (ref 26.0–34.0)
MCHC: 30.8 g/dL (ref 30.0–36.0)
MCV: 103.2 fL — ABNORMAL HIGH (ref 80.0–100.0)
Platelets: 449 10*3/uL — ABNORMAL HIGH (ref 150–400)
RBC: 4.4 MIL/uL (ref 4.22–5.81)
RDW: 16 % — ABNORMAL HIGH (ref 11.5–15.5)
WBC: 10.9 10*3/uL — ABNORMAL HIGH (ref 4.0–10.5)
nRBC: 0 % (ref 0.0–0.2)

## 2019-11-06 LAB — GLUCOSE, CAPILLARY
Glucose-Capillary: 100 mg/dL — ABNORMAL HIGH (ref 70–99)
Glucose-Capillary: 131 mg/dL — ABNORMAL HIGH (ref 70–99)
Glucose-Capillary: 102 mg/dL — ABNORMAL HIGH (ref 70–99)
Glucose-Capillary: 119 mg/dL — ABNORMAL HIGH (ref 70–99)

## 2019-11-06 MED ORDER — SODIUM ZIRCONIUM CYCLOSILICATE 10 G PO PACK
10.0000 g | PACK | Freq: Once | ORAL | Status: AC
  Administered 2019-11-06: 10 g via ORAL
  Filled 2019-11-06: qty 1

## 2019-11-06 NOTE — Progress Notes (Signed)
OT Treatment Note  Excellent co-treatment session with PT. Pt ambulated @ 100 ft with +2 Min A @ RW level. Pt sat to rest and then politely declined to walk further. Pt however dancing in recliner to music while interacting with staff in hallway. Pt completed exercise during "beachball game" while seated. Increased ability to hold water bottle and bring to mouth with improved lip closure around bottle and decreased spillage. Facetimed Mom at end of session. Making excellent progress. Feel pt appropriate to DC home with 24/7 assistance when medically stable. Encourage staff to mobilize OOB daily and use posey seat belt alarm to reduce risk of falls.     11/06/19 1649  OT Visit Information  Last OT Received On 10/30/19  Assistance Needed +2 (for mobility)  PT/OT/SLP Co-Evaluation/Treatment Yes  Reason for Co-Treatment Complexity of the patient's impairments (multi-system involvement);Necessary to address cognition/behavior during functional activity;For patient/therapist safety;To address functional/ADL transfers  OT goals addressed during session ADL's and self-care;Strengthening/ROM  History of Present Illness 33 y.o. male with h/o Down syndrome, seizure disorder, on multiple meds, lives at home with his mother, brought to ED with lethargy and weakness. Dx with COVID-19 and transferred to [facility]; acute hypoxic respiratory failure, AKI   Precautions  Precautions Fall  Precaution Comments looks to have a urostomy like pouch  Pain Assessment  Pain Assessment Faces  Faces Pain Scale 0  Cognition  Arousal/Alertness Awake/alert  Behavior During Therapy Restless;Impulsive (most likely baseline)  Overall Cognitive Status History of cognitive impairments - at baseline  General Comments brighter affect; laughing and dancing with therapists; appropriate  Upper Extremity Assessment  Upper Extremity Assessment Generalized weakness (improved from previous visist. Ableot bring bottle to mouth)  Lower  Extremity Assessment  Lower Extremity Assessment Defer to PT evaluation  ADL  Overall ADL's  Needs assistance/impaired  Eating/Feeding Minimal assistance;Sitting  Eating/Feeding Details (indicate cue type and reason) increased ability to bring bottle to mouth adn improved lip closure around bottle with decreased spillage  Grooming Min guard;Sitting  Functional mobility during ADLs Minimal assistance;Cueing for sequencing;Cueing for safety;Rolling walker;+2 for physical assistance  Bed Mobility  Overal bed mobility Needs Assistance  Bed Mobility Supine to Sit  Supine to sit Min assist  Balance  Overall balance assessment Needs assistance  Sitting-balance support Bilateral upper extremity supported;Feet unsupported  Sitting balance-Leahy Scale Fair  Sitting balance - Comments feet not reaching the floor pt using bil UEs for support but needed no additional assist  Standing balance support Bilateral upper extremity supported;During functional activity  Standing balance-Leahy Scale Poor  Transfers  Overall transfer level Needs assistance  Equipment used Rolling walker (2 wheeled)  Transfers Sit to/from Stand  Sit to Stand Min assist  General transfer comment Extra time to  initiate, stood from bed at Rw with min assist.  General Comments  General comments (skin integrity, edema, etc.) Facetimed Mom at end of session  Other Exercises  Other Exercises Tossing beachball back and forth multiple times  Other Exercises manipulating/squezing squeeze ball  OT - End of Session  Equipment Utilized During Treatment Gait belt;Rolling walker  Activity Tolerance Patient tolerated treatment well  Patient left in chair;with call bell/phone within reach;with chair alarm set  Nurse Communication Mobility status  OT Assessment/Plan  OT Plan Discharge plan remains appropriate  OT Visit Diagnosis Unsteadiness on feet (R26.81);Other abnormalities of gait and mobility (R26.89);Muscle weakness (generalized)  (M62.81)  OT Frequency (ACUTE ONLY) Min 3X/week  Recommendations for Other Services PT consult  Follow Up Recommendations Home  health OT;Supervision/Assistance - 24 hour  OT Equipment Tub/shower bench;Wheelchair (measurements OT);Wheelchair cushion (measurements OT)  AM-PAC OT "6 Clicks" Daily Activity Outcome Measure (Version 2)  Help from another person eating meals? 3  Help from another person taking care of personal grooming? 3  Help from another person toileting, which includes using toliet, bedpan, or urinal? 2  Help from another person bathing (including washing, rinsing, drying)? 2  Help from another person to put on and taking off regular upper body clothing? 3  Help from another person to put on and taking off regular lower body clothing? 2  6 Click Score 15  OT Goal Progression  Progress towards OT goals Progressing toward goals  Acute Rehab OT Goals  Patient Stated Goal pt unable to state goals  OT Goal Formulation With family  Time For Goal Achievement 11/16/19  Potential to Achieve Goals Good  ADL Goals  Pt Will Perform Grooming with set-up;with supervision;sitting  Pt Will Perform Upper Body Dressing with set-up;with supervision;sitting  Pt Will Perform Lower Body Dressing with min assist;sit to/from stand  Pt Will Transfer to Toilet with min assist;bedside commode;ambulating  Pt Will Perform Toileting - Clothing Manipulation and hygiene with min assist;sit to/from stand;sitting/lateral leans  Additional ADL Goal #1 Pt will perform bed mobility with Min A in preparation for ADLs  Additional ADL Goal #2 Pt will sustain attention to simple ADL with min cues  OT Time Calculation  OT Start Time (ACUTE ONLY) 1344  OT Stop Time (ACUTE ONLY) 1445  OT Time Calculation (min) 61 min  OT General Charges  $OT Visit 1 Visit  OT Treatments  $Self Care/Home Management  8-22 mins  $Therapeutic Activity 8-22 mins  Luisa Dago, OT/L   Acute OT Clinical Specialist Acute  Rehabilitation Services Pager [phone] Office [phone]

## 2019-11-06 NOTE — Progress Notes (Signed)
Spoke with patient's mother and updated on condition. Pt and mother spoke with each other via facetime.

## 2019-11-06 NOTE — Progress Notes (Signed)
Physical Therapy Treatment Patient Details Name: Isaac Murphy MRN: [id number] DOB: Aug 09, 1986 Today's Date: 11/06/2019    History of Present Illness 33 y.o. male with h/o Down syndrome, seizure disorder, on multiple meds, lives at home with his mother, brought to ED with lethargy and weakness. Dx with COVID-19 and transferred to [facility]; acute hypoxic respiratory failure, AKI     PT Comments    The patient participated in ambulation x 100' with Rw today. Patient's SPO2 on RA .93% . The patient was very animated, responding to music, batted beach ball multiple times, HR 120's.Hopefully pt will progress to return home.   Follow Up Recommendations  Home health PT;Supervision/Assistance - 24 hour     Equipment Recommendations  Wheelchair (measurements PT);Wheelchair cushion (measurements PT)(may not need)    Recommendations for Other Services       Precautions / Restrictions Precautions Precautions: Fall Precaution Comments: looks to have a urostomy like pouch    Mobility  Bed Mobility   Bed Mobility: Supine to Sit Rolling: Min assist;Mod assist         General bed mobility comments: multimodal cues, extra time to initiate. patient did assist with legds and trunk. At times, indicated that he wanted to perform the  activity.  Transfers Overall transfer level: Needs assistance Equipment used: Rolling walker (2 wheeled) Transfers: Sit to/from Stand Sit to Stand: Min assist         General transfer comment: Extra time to  initiate, stood from bed at Rw with min assist.  Ambulation/Gait Ambulation/Gait assistance: Min assist;+2 safety/equipment Gait Distance (Feet): 100 Feet Assistive device: Rolling walker (2 wheeled) Gait Pattern/deviations: Step-to pattern;Step-through pattern;Drifts right/left Gait velocity: decr   General Gait Details: required assistance to guide RW in a forward direction, tending to veer to left(? RW malfunction)   Stairs              Wheelchair Mobility    Modified Rankin (Stroke Patients Only)       Balance Overall balance assessment: Needs assistance Sitting-balance support: Bilateral upper extremity supported;Feet unsupported Sitting balance-Leahy Scale: Fair     Standing balance support: Bilateral upper extremity supported;During functional activity Standing balance-Leahy Scale: Poor Standing balance comment: trunk tendign to sway                            Cognition Arousal/Alertness: Awake/alert Behavior During Therapy: WFL for tasks assessed/performed Overall Cognitive Status: History of cognitive impairments - at baseline                                 General Comments: quite cheerful and interactive with therapy, hitting ball back and forth with therapist, moving  and grooving to music, laughing. speaking in a few words.      Exercises      General Comments        Pertinent Vitals/Pain Pain Assessment: No/denies pain Faces Pain Scale: No hurt    Home Living                      Prior Function            PT Goals (current goals can now be found in the care plan section) Progress towards PT goals: Progressing toward goals    Frequency    Min 3X/week      PT Plan Current plan remains appropriate  Co-evaluation PT/OT/SLP Co-Evaluation/Treatment: Yes Reason for Co-Treatment: For patient/therapist safety PT goals addressed during session: Mobility/safety with mobility OT goals addressed during session: ADL's and self-care      AM-PAC PT "6 Clicks" Mobility   Outcome Measure  Help needed turning from your back to your side while in a flat bed without using bedrails?: A Little Help needed moving from lying on your back to sitting on the side of a flat bed without using bedrails?: A Little Help needed moving to and from a bed to a chair (including a wheelchair)?: A Lot Help needed standing up from a chair using your arms (e.g.,  wheelchair or bedside chair)?: A Little Help needed to walk in hospital room?: A Lot Help needed climbing 3-5 steps with a railing? : Total 6 Click Score: 14    End of Session Equipment Utilized During Treatment: Gait belt Activity Tolerance: Patient tolerated treatment well Patient left: in chair;with call bell/phone within reach;with chair alarm set(waist belt) Nurse Communication: Mobility status PT Visit Diagnosis: Other abnormalities of gait and mobility (R26.89);Muscle weakness (generalized) (M62.81)     Time: 2703-5009 PT Time Calculation (min) (ACUTE ONLY): 60 min  Charges:  $Gait Training: 23-37 mins                     Isaac Murphy PT Acute Rehabilitation Services Pager [phone] Office [phone]    Rada Hay 11/06/2019, 4:04 PM

## 2019-11-06 NOTE — Progress Notes (Signed)
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 intake as well as Ayrianna Mcginniss cough for several days.  He was febrile, hypoxic requiring 8 L, tested positive for Covid and was admitted to [facility]  Assessment & Plan:   Principal Problem:   Acute respiratory failure with hypoxia [facility]) Active Problems:   COVID-19   Seizure disorder (HCC)  Acute Hypoxic Respiratory Failure due to Covid-19 Viral Illness/pneumonia -Patient has been treated with remdesivir and completed Phillis Thackeray 5-day course, currently on steroids, clinically improving and he is on room air -Status post convalescent plasma on 10/30/2019 -will stop steroids -There was concern for aspiration pneumonia and he was started on Unasyn on 12/12, will complete Zelma Mazariego 7-day course  COVID-19 Labs  No results for input(s): DDIMER, FERRITIN, LDH, CRP in the last 72 hours.  No results found for: SARSCOV2NAA  Acute kidney injury, history of solitary kidney -Previous creatinine was 1.44 in 2019.  On presentation his creatinine was 3.7 with hyperkalemia and potassium of 5.5. -gradually improving, follow creatinine daily -unable to measure output well, consider foley if needed -repeat US from 12/18 with no hydronephrosis noted -patient had an ultrasound of the kidney on 12/9 which showed mild right-sided hydronephrosis.  We will repeat renal ultrasound (as above) -Case was discussed with Dr. Marisue Humble of neurology  Hyperkalemia -improved, but persistent, repeat lokelma -will d/c steroids  Hypernatremia -Sodium gradually improving, 147 this AM -continue D5W at 100 cc/hr  Seizure disorder -Continue Depakote, Keppra  Suspected aspiration pneumonia -SLP evaluated, currently on dysphagia  2 diet with thin liquids  Agitation/confusion -Due to underlying Down syndrome, unfamiliar circumstances, possibly steroids contributing -As needed Ativan and Haldol, currently in soft restraints  Sinus tachycardia -No fever, patient was started on metoprolol by Dr. Jerral Ralph, continue.  Elfreida Heggs degree of sinus tach is probably expected due to agitation as well as acute illness/dehydration  DVT prophylaxis: lovenox Code Status: full  Family Communication: none at bedside Disposition Plan: pending improvement   Consultants:   none  Procedures:  Echo IMPRESSIONS    1. Left ventricular ejection fraction, by visual estimation, is 50 to 55%. The left ventricle has normal function. There is no left ventricular hypertrophy.  2. Left ventricular diastolic parameters are consistent with Grade I diastolic dysfunction (impaired relaxation).  3. The left ventricle has no regional wall motion abnormalities.  4. Global right ventricle has normal systolic function.The right ventricular size is normal.  5. Left atrial size was normal.  6. Right atrial size was normal.  7. Small pericardial effusion.  8. The mitral valve is normal in structure. No evidence of mitral valve regurgitation. No evidence of mitral stenosis.  9. The tricuspid valve is normal in structure. Tricuspid valve regurgitation is trivial. 10. The aortic valve is normal in structure. Aortic valve regurgitation is not visualized. No evidence of aortic valve sclerosis or stenosis. 11. The pulmonic valve was normal in structure. Pulmonic valve regurgitation is not visualized. 12. The inferior vena cava is normal in size with greater than 50% respiratory variability, suggesting right atrial pressure of 3 mmHg. 13. Technically difficult; low normal LV systolic function; grade 1 diastolic dysfunction; oscillating density in RA likely chiari network; small pericardial effusion.  Antimicrobials:  Anti-infectives (From admission, onward)    Start  Dose/Rate Route Frequency Ordered Stop   10/30/19 1600  Ampicillin-Sulbactam (UNASYN) 3 g in sodium chloride 0.9 % 100 mL IVPB     3 g 200 mL/hr over 30 Minutes Intravenous Every 8 hours 10/30/19 1559     10/30/19 1000  remdesivir 100 mg in sodium chloride 0.9 % 100 mL IVPB     100 mg 200 mL/hr over 30 Minutes Intravenous Every 24 hours 10/28/19 2344 11/01/19 1045   10/29/19 2200  cefTRIAXone (ROCEPHIN) 1 g in sodium chloride 0.9 % 100 mL IVPB  Status:  Discontinued     1 g 200 mL/hr over 30 Minutes Intravenous Every 24 hours 10/28/19 2350 10/30/19 1559   10/29/19 0000  remdesivir 200 mg in sodium chloride 0.9% 250 mL IVPB     200 mg 580 mL/hr over 30 Minutes Intravenous Once 10/28/19 2344 10/29/19 0344   10/29/19 0000  azithromycin (ZITHROMAX) 500 mg in sodium chloride 0.9 % 250 mL IVPB  Status:  Discontinued     500 mg 250 mL/hr over 60 Minutes Intravenous Every 24 hours 10/28/19 2350 10/30/19 1115   10/29/19 0000  cefTRIAXone (ROCEPHIN) 1 g in sodium chloride 0.9 % 100 mL IVPB     1 g 200 mL/hr over 30 Minutes Intravenous  Once 10/28/19 2355 10/29/19 0120     Subjective: 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  Objective: Vitals:   11/05/19 1947 11/06/19 0508 11/06/19 1100 11/06/19 1508  BP: 107/81  (!) 127/108 104/72  Pulse: (!) 114  (!) 114 97  Resp: 18  18 18   Temp: 98.6 F (37 C) 98.6 F (37 C) 99.3 F (37.4 C) 98.2 F (36.8 C)  TempSrc: Oral Oral Axillary Oral  SpO2: 98%  100% 96%  Weight:      Height:        Intake/Output Summary (Last 24 hours) at 11/06/2019 1653 Last data filed at 11/06/2019 1100 Gross per 24 hour  Intake 170 ml  Output 1400 ml  Net -1230 ml   Filed Weights   10/28/19 2315 10/29/19 0430  Weight: 74.5 kg 75 kg    Examination:  General exam: Appears calm and comfortable  Respiratory system: Clear to auscultation. Respiratory effort normal. Cardiovascular system: RRR Gastrointestinal system: Abdomen is nondistended, soft and  nontender.  Central nervous system: Alert and disoriented. No focal neurological deficits. Extremities: no LEE Skin: No rashes, lesions or ulcers  Data Reviewed: I have personally reviewed following labs and imaging studies  CBC: Recent Labs  Lab 10/31/19 0028 11/01/19 0900 11/02/19 0634 11/05/19 0725 11/06/19 0437  WBC 12.5* 9.6 9.8 11.0* 10.9*  NEUTROABS 9.5* 7.9* 7.3  --   --   HGB 13.6 13.2 13.4 16.2 14.0  HCT 45.0 43.8 45.7 52.7* 45.4  MCV 104.7* 105.8* 107.0* 105.0* 103.2*  PLT 668* 693* 666* 575* 449*   Basic Metabolic Panel: Recent Labs  Lab 10/31/19 0028 11/01/19 0900 11/02/19 0634 11/03/19 1055 11/04/19 0055 11/05/19 0725 11/06/19 0437  NA 150* 156* 159* 159* 157* 151* 147*  K 5.3* 5.5* 4.8 4.6 5.0 6.2* 5.4*  CL 109 116* 118* 119* 118* 115* 115*  CO2 27 28 28 28 27 23  20*  GLUCOSE 106* 100* 123* 129* 129* 131* 108*  BUN 55* 61* 61* 62* 61* 67* 62*  CREATININE 2.53* 2.62* 2.65* 2.84* 2.75* 2.90* 2.76*  CALCIUM 9.5 9.1 9.3 9.6 9.5 9.5 9.2  MG 2.7* 3.0*  --   --   --   --   --  PHOS 3.2 4.4  --   --   --   --   --    GFR: Estimated Creatinine Clearance: 33.7 mL/min (Yehudis Monceaux) (by C-G formula based on SCr of 2.76 mg/dL (H)). Liver Function Tests: Recent Labs  Lab 10/31/19 0028 11/01/19 0900 11/02/19 0634 11/06/19 0437  AST 46* 46* 41 31  ALT 31 35 43 75*  ALKPHOS 92 89 92 98  BILITOT 0.7 0.7 0.6 0.9  PROT 8.6* 8.4* 8.2* 7.5  ALBUMIN 3.2* 3.2* 3.3* 3.1*   No results for input(s): LIPASE, AMYLASE in the last 168 hours. No results for input(s): AMMONIA in the last 168 hours. Coagulation Profile: No results for input(s): INR, PROTIME in the last 168 hours. Cardiac Enzymes: No results for input(s): CKTOTAL, CKMB, CKMBINDEX, TROPONINI in the last 168 hours. BNP (last 3 results) No results for input(s): PROBNP in the last 8760 hours. HbA1C: No results for input(s): HGBA1C in the last 72 hours. CBG: Recent Labs  Lab 11/05/19 1741 11/05/19 2036  11/06/19 0741 11/06/19 1117 11/06/19 1619  GLUCAP 89 113* 100* 131* 102*   Lipid Profile: No results for input(s): CHOL, HDL, LDLCALC, TRIG, CHOLHDL, LDLDIRECT in the last 72 hours. Thyroid Function Tests: No results for input(s): TSH, T4TOTAL, FREET4, T3FREE, THYROIDAB in the last 72 hours. Anemia Panel: No results for input(s): VITAMINB12, FOLATE, FERRITIN, TIBC, IRON, RETICCTPCT in the last 72 hours. Sepsis Labs: Recent Labs  Lab 10/31/19 0028 11/01/19 0900  PROCALCITON 0.31 0.16    Recent Results (from the past 240 hour(s))  Blood Culture (routine x 2)     Status: None   Collection Time: 10/28/19 10:13 PM   Specimen: BLOOD  Result Value Ref Range Status   Specimen Description BLOOD RIGHT ANTECUBITAL  Final   Special Requests   Final    BOTTLES DRAWN AEROBIC AND ANAEROBIC Blood Culture results may not be optimal due to an inadequate volume of blood received in culture bottles   Culture   Final    NO GROWTH 5 DAYS Performed at [facility], [address] [place], [place] [address]    Report Status 11/02/2019 FINAL  Final  Urine culture     Status: None   Collection Time: 10/30/19  8:48 PM   Specimen: Urine, Random  Result Value Ref Range Status   Specimen Description   Final    URINE, RANDOM Performed at [facility], [address] [place], [place] [address]    Special Requests   Final    NONE Performed at [facility], [address] [place], [place] [address]    Culture   Final    NO GROWTH Performed at [facility], [address] [place], [place] [address]    Report Status 11/01/2019 FINAL  Final         Radiology Studies: US RENAL  Result Date: 11/06/2019 CLINICAL DATA:  Acute kidney injury. EXAM: RENAL / URINARY TRACT ULTRASOUND COMPLETE COMPARISON:  October 29, 2019.  May 29, 2018. FINDINGS: Right Kidney: Renal measurements: 10.3 x 5.5 x 4.5 cm = volume: 135 mL. Increased echogenicity of  renal parenchyma is noted. No mass or hydronephrosis visualized. Left Kidney: Not visualized. Bladder: Not visualized. Other: None. IMPRESSION: Left kidney and urinary bladder are not visualized. Increased echogenicity of right renal parenchyma is noted suggesting medical renal disease. No hydronephrosis is noted currently. Electronically Signed   By: Lupita RaiderJames  Green Jr M.D.   On: 11/06/2019 09:48  Scheduled Meds: . divalproex  500 mg Oral Q12H  . enoxaparin (LOVENOX) injection  40 mg Subcutaneous Q24H  . feeding supplement (PRO-STAT SUGAR FREE 64)  30 mL Oral BID  . insulin aspart  1 Units Subcutaneous TID WC  . levETIRAcetam  1,500 mg Oral BID  . methylPREDNISolone (SOLU-MEDROL) injection  40 mg Intravenous Q12H  . metoprolol tartrate  25 mg Oral BID  . sodium chloride flush  10-40 mL Intracatheter Q12H   Continuous Infusions: . ampicillin-sulbactam (UNASYN) IV 3 g (11/06/19 1231)  . dextrose 100 mL/hr at 11/06/19 1614     LOS: 9 days    Time spent: over 30 min    Fayrene Helper, MD [facility] Pager AMION  If 7PM-7AM, please contact night-coverage [email or web address] Password [email or web address] 11/06/2019, 4:53 PM

## 2019-11-06 NOTE — Progress Notes (Addendum)
  Speech Language Pathology Treatment: Dysphagia  Patient Details Name: Kedron Uno MRN: [id number] DOB: Feb 15, 1986 Today's Date: 11/06/2019 Time: 7824-2353 SLP Time Calculation (min) (ACUTE ONLY): 15 min  Assessment / Plan / Recommendation Clinical Impression  Limited session today. Deon initially receptive to therapist who untied wrist restraints. Lips dry and therapist provided moisture with toothette. Pt held water bottle taking several sips independently without incident. He leaned to side of chair then moved to opposite side when therapist moved to the other side of chair. Holding head in hand- he denied pain. No further po consumed. Will continue attempts with solids and modify or upgrade if needed.   HPI        SLP Plan  Continue with current plan of care       Recommendations  Diet recommendations: Dysphagia 2 (fine chop);Thin liquid Liquids provided via: (bottle) Medication Administration: Crushed with puree Supervision: Staff to assist with self feeding;Full supervision/cueing for compensatory strategies Compensations: Minimize environmental distractions;Slow rate;Small sips/bites;Lingual sweep for clearance of pocketing Postural Changes and/or Swallow Maneuvers: Seated upright 90 degrees                Oral Care Recommendations: Oral care BID Follow up Recommendations: None SLP Visit Diagnosis: Dysphagia, oral phase (R13.11) Plan: Continue with current plan of care       GO                Houston Siren 11/06/2019, 5:55 PM

## 2019-11-07 LAB — COMPREHENSIVE METABOLIC PANEL
ALT: 51 U/L — ABNORMAL HIGH (ref 0–44)
AST: 24 U/L (ref 15–41)
Albumin: 2.8 g/dL — ABNORMAL LOW (ref 3.5–5.0)
Alkaline Phosphatase: 80 U/L (ref 38–126)
Anion gap: 10 (ref 5–15)
BUN: 49 mg/dL — ABNORMAL HIGH (ref 6–20)
CO2: 23 mmol/L (ref 22–32)
Calcium: 8.9 mg/dL (ref 8.9–10.3)
Chloride: 112 mmol/L — ABNORMAL HIGH (ref 98–111)
Creatinine, Ser: 2.33 mg/dL — ABNORMAL HIGH (ref 0.61–1.24)
GFR calc Af Amer: 42 mL/min — ABNORMAL LOW (ref >60)
GFR calc non Af Amer: 36 mL/min — ABNORMAL LOW (ref >60)
Glucose, Bld: 97 mg/dL (ref 70–99)
Potassium: 4.3 mmol/L (ref 3.5–5.1)
Sodium: 145 mmol/L (ref 135–145)
Total Bilirubin: 0.4 mg/dL (ref 0.3–1.2)
Total Protein: 6.7 g/dL (ref 6.5–8.1)

## 2019-11-07 LAB — CBC WITH DIFFERENTIAL/PLATELET
Abs Immature Granulocytes: 0.11 10*3/uL — ABNORMAL HIGH (ref 0.00–0.07)
Basophils Absolute: 0 10*3/uL (ref 0.0–0.1)
Basophils Relative: 0 %
Eosinophils Absolute: 0 10*3/uL (ref 0.0–0.5)
Eosinophils Relative: 0 %
HCT: 41.8 % (ref 39.0–52.0)
Hemoglobin: 12.8 g/dL — ABNORMAL LOW (ref 13.0–17.0)
Immature Granulocytes: 1 %
Lymphocytes Relative: 23 %
Lymphs Abs: 1.8 10*3/uL (ref 0.7–4.0)
MCH: 32 pg (ref 26.0–34.0)
MCHC: 30.6 g/dL (ref 30.0–36.0)
MCV: 104.5 fL — ABNORMAL HIGH (ref 80.0–100.0)
Monocytes Absolute: 0.8 10*3/uL (ref 0.1–1.0)
Monocytes Relative: 11 %
Neutro Abs: 5.1 10*3/uL (ref 1.7–7.7)
Neutrophils Relative %: 65 %
Platelets: 313 10*3/uL (ref 150–400)
RBC: 4 MIL/uL — ABNORMAL LOW (ref 4.22–5.81)
RDW: 15.9 % — ABNORMAL HIGH (ref 11.5–15.5)
WBC: 7.9 10*3/uL (ref 4.0–10.5)
nRBC: 0 % (ref 0.0–0.2)

## 2019-11-07 LAB — GLUCOSE, CAPILLARY
Glucose-Capillary: 108 mg/dL — ABNORMAL HIGH (ref 70–99)
Glucose-Capillary: 71 mg/dL (ref 70–99)
Glucose-Capillary: 73 mg/dL (ref 70–99)
Glucose-Capillary: 93 mg/dL (ref 70–99)
Glucose-Capillary: 97 mg/dL (ref 70–99)

## 2019-11-07 LAB — FERRITIN: Ferritin: 244 ng/mL (ref 24–336)

## 2019-11-07 LAB — MAGNESIUM: Magnesium: 2.2 mg/dL (ref 1.7–2.4)

## 2019-11-07 LAB — C-REACTIVE PROTEIN: CRP: <0.5 mg/dL (ref <1.0)

## 2019-11-07 NOTE — Progress Notes (Signed)
Family Update  Primary RN Eustaquio Maize called patients mother via Facetime to give update and verify that patient is at his baseline. Mother states he is responding as he usually does at home. Primary RN will report finding to Dr. Florene Glen whom wants to initiate discharge planning.

## 2019-11-07 NOTE — Plan of Care (Signed)

## 2019-11-07 NOTE — Progress Notes (Addendum)
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 intke s well s  cough for severl dys.  He ws febrile, hypoxic requiring 8 L, tested positive for Covid nd ws dmitted to [facility]  Assessment & Pln:   Principl Problem:   Acute respirtory filure with hypoxi [facility]) Active Problems:   COVID-19   Seizure disorder (HCC)  Acute Hypoxic Respirtory Filure due to Covid-19 Virl Illness/pneumoni -Ptient hs been treted with remdesivir nd completed  5-dy course, currently on steroids, cliniclly improving nd he is on room ir -Sttus post convlescent plsm on 10/30/2019 -will stop steroids -There ws concern for spirtion pneumoni nd he ws strted on Unsyn on 12/12, will complete  7-dy course  COVID-19 Lbs  Recent Lbs    11/07/19 0703  FERRITIN 244  CRP <0.5    No results found for: SARSCOV2NAA  Acute kidney injury, history of solitry kidney -Previous cretinine ws 1.44 in 2019.  On presenttion his cretinine ws 3.7 with hyperklemi nd potssium of 5.5. -grdully improving, follow  -unble to mesure output well, consider foley if needed -repet Kore from 12/18 with no hydronephrosis noted -ptient hd n ultrsound of the kidney on 12/9 which showed mild right-sided hydronephrosis.  We will repet renl ultrsound (s bove) -Cse ws discussed with Dr. Mrisue Humble of neurology  Hyperklemi -improved, but persistent, repet lokelm -will d/c steroids  Hyperntremi -Sodium grdully improving, 145 this AM -continue D5W t 100 cc/hr  Seizure disorder -Continue Depkote, Keppr  Suspected spirtion pneumoni -SLP evluted, currently on dysphgi 2 diet with thin liquids   Agittion/confusion -Due to underlying Down syndrome, unfmilir circumstnces, possibly steroids contributing -As needed Ativn nd Hldol  Sinus tchycrdi - improving  DVT prophylxis: lovenox Code Sttus: full  Fmily Communiction: none t bedside - discussed with mother 12/18 nd 12/19 Disposition Pln: pending improvement   Consultnts:   none  Procedures:  Echo IMPRESSIONS    1. Left ventriculr ejection frction, by visul estimtion, is 50 to 55%. The left ventricle hs norml function. There is no left ventriculr hypertrophy.  2. Left ventriculr distolic prmeters re consistent with Grde I distolic dysfunction (impired relxtion).  3. The left ventricle hs no regionl wll motion bnormlities.  4. Globl right ventricle hs norml systolic function.The right ventriculr size is norml.  5. Left tril size ws norml.  6. Right tril size ws norml.  7. Smll pericrdil effusion.  8. The mitrl vlve is norml in structure. No evidence of mitrl vlve regurgittion. No evidence of mitrl stenosis.  9. The tricuspid vlve is norml in structure. Tricuspid vlve regurgittion is trivil. 10. The ortic vlve is norml in structure. Aortic vlve regurgittion is not visulized. No evidence of ortic vlve sclerosis or stenosis. 11. The pulmonic vlve ws norml in structure. Pulmonic vlve regurgittion is not visulized. 12. The inferior ven cv is norml in size with greter thn 50% respirtory vribility, suggesting right tril pressure of 3 mmHg. 13. Techniclly difficult; low norml LV systolic function; grde 1 distolic dysfunction; oscillting density in RA likely chiri network; smll pericrdil effusion.  Antimicrobils:  Anti-infectives (From dmission, onwrd)   Strt     Dose/Rte Route Frequency Ordered Stop   10/30/19 1600  Ampicillin-Sulbctm (UNASYN) 3 g in sodium chloride 0.9 % 100 mL IVPB  Status:  Discontinued     3 g 200  mL/hr over 30 Minutes Intravenous Every 8 hours 10/30/19 1559 11/06/19 1934   10/30/19 1000  remdesivir 100 mg in sodium chloride 0.9 % 100 mL IVPB     100 mg 200 mL/hr over 30 Minutes Intravenous Every 24 hours 10/28/19 2344 11/01/19 1045   10/29/19 2200  cefTRIXone (ROCEPHIN) 1 g in sodium chloride 0.9 % 100 mL IVPB  Status:  Discontinued     1 g 200 mL/hr over 30 Minutes Intravenous Every 24 hours 10/28/19 2350 10/30/19 1559   10/29/19 0000  remdesivir 200 mg in sodium chloride 0.9% 250 mL IVPB     200 mg 580 mL/hr over 30 Minutes Intravenous Once 10/28/19 2344 10/29/19 0344   10/29/19 0000  azithromycin (ZITHROMX) 500 mg in sodium chloride 0.9 % 250 mL IVPB  Status:  Discontinued     500 mg 250 mL/hr over 60 Minutes Intravenous Every 24 hours 10/28/19 2350 10/30/19 1115   10/29/19 0000  cefTRIXone (ROCEPHIN) 1 g in sodium chloride 0.9 % 100 mL IVPB     1 g 200 mL/hr over 30 Minutes Intravenous  Once 10/28/19 2355 10/29/19 0120     Subjective: Unable to communicate  Objective: Vitals:   11/06/19 2153 11/07/19 0737 11/07/19 1146 11/07/19 1500  BP:  103/65 115/74 137/78  Pulse: 62 77 77 78  Resp:  <BDTEXTTG> Temp:  98.7 F (37.1 C) 98.7 F (37.1 C)   TempSrc:  xillary xillary   SpO2: 97% 99% 100% 100%  Weight:      Height:        Intake/Output Summary (Last 24 hours) at 11/07/2019 1530 Last data filed at 11/07/2019 1500 Gross per 24 hour  Intake 3064.39 ml  Output 1100 ml  Net 1964.39 ml   Filed Weights   10/28/19 2315 10/29/19 0430  Weight: 74.5 kg 75 kg    Examination:  General: No acute distress. Cardiovascular: RRR Lungs: unlabored bdomen: Soft, nontender, nondistended Neurological: lert and disoriented. Moves all extremities 4. Cranial nerves II through XII grossly intact. Skin: Warm and dry. No rashes or lesions. Extremities: No clubbing or cyanosis. No edema.   Data Reviewed: I have personally reviewed following labs and imaging studies  CBC:  Recent Labs  Lab 11/01/19 0900 11/02/19 0634 11/05/19 0725 11/06/19 0437 11/07/19 0703  WBC 9.6 9.8 11.0* 10.9* 7.9  NEUTROBS 7.9* 7.3  --   --  5.1  HGB 13.2 13.4 16.2 14.0 12.8*  HCT 43.8 45.7 52.7* 45.4 41.8  MCV 105.8* 107.0* 105.0* 103.2* 104.5*  PLT 693* 666* 575* 449* 313   Basic Metabolic Panel: Recent Labs  Lab 11/01/19 0900 11/03/19 1055 11/04/19 0055 11/05/19 0725 11/06/19 0437 11/07/19 0703  N 156* 159* 157* 151* 147* 145  K 5.5* 4.6 5.0 6.2* 5.4* 4.3  CL 116* 119* 118* 115* 115* 112*  CO2 <BDTEXTTG> 20* 23  GLUCOSE 100* 129* 129* 131* 108* 97  BUN 61* 62* 61* 67* 62* 49*  CRETININE 2.62* 2.84* 2.75* 2.90* 2.76* 2.33*  CLCIUM 9.1 9.6 9.5 9.5 9.2 8.9  MG 3.0*  --   --   --   --  2.2  PHOS 4.4  --   --   --   --   --    GFR: Estimated Creatinine Clearance: 39.9 mL/min () (by C-G formula based on SCr of 2.33 mg/dL (H)). Liver Function Tests: Recent Labs  Lab 11/01/19 0900 11/02/19  [id number] 11/06/19 0437 11/07/19 0703  AST 46* 41 31 24  ALT 35 43 75* 51*  ALKPHOS 89 92 98 80  BILITOT 0.7 0.6 0.9 0.4  PROT 8.4* 8.2* 7.5 6.7  ALBUMIN 3.2* 3.3* 3.1* 2.8*   No results for input(s): LIPASE, AMYLASE in the last 168 hours. No results for input(s): AMMONIA in the last 168 hours. Coagulation Profile: No results for input(s): INR, PROTIME in the last 168 hours. Cardiac Enzymes: No results for input(s): CKTOTAL, CKMB, CKMBINDEX, TROPONINI in the last 168 hours. BNP (last 3 results) No results for input(s): PROBNP in the last 8760 hours. HbA1C: No results for input(s): HGBA1C in the last 72 hours. CBG: Recent Labs  Lab 11/06/19 2033 11/07/19 0022 11/07/19 0403 11/07/19 0744 11/07/19 1103  GLUCAP 119* 97 93 73 71   Lipid Profile: No results for input(s): CHOL, HDL, LDLCALC, TRIG, CHOLHDL, LDLDIRECT in the last 72 hours. Thyroid Function Tests: No results for input(s): TSH, T4TOTAL, FREET4, T3FREE, THYROIDAB in the last 72 hours. Anemia Panel:  Recent Labs    11/07/19 0703  FERRITIN 244   Sepsis Labs: Recent Labs  Lab 11/01/19 0900  PROCALCITON 0.16    Recent Results (from the past 240 hour(s))  Blood Culture (routine x 2)     Status: None   Collection Time: 10/28/19 10:13 PM   Specimen: BLOOD  Result Value Ref Range Status   Specimen Description BLOOD RIGHT ANTECUBITAL  Final   Special Requests   Final    BOTTLES DRAWN AEROBIC AND ANAEROBIC Blood Culture results may not be optimal due to an inadequate volume of blood received in culture bottles   Culture   Final    NO GROWTH 5 DAYS Performed at [facility], [address] [place], [place] [address]    Report Status 11/02/2019 FINAL  Final  Urine culture     Status: None   Collection Time: 10/30/19  8:48 PM   Specimen: Urine, Random  Result Value Ref Range Status   Specimen Description   Final    URINE, RANDOM Performed at [facility], [address] [place], [place] [address]    Special Requests   Final    NONE Performed at [facility], [address] [place], [place] [address]    Culture   Final    NO GROWTH Performed at [facility], [address] [place], [place] [address]    Report Status 11/01/2019 FINAL  Final         Radiology Studies: US RENAL  Result Date: 11/06/2019 CLINICAL DATA:  Acute kidney injury. EXAM: RENAL / URINARY TRACT ULTRASOUND COMPLETE COMPARISON:  October 29, 2019.  May 29, 2018. FINDINGS: Right Kidney: Renal measurements: 10.3 x 5.5 x 4.5 cm = volume: 135 mL. Increased echogenicity of renal parenchyma is noted. No mass or hydronephrosis visualized. Left Kidney: Not visualized. Bladder: Not visualized. Other: None. IMPRESSION: Left kidney and urinary bladder are not visualized. Increased echogenicity of right renal parenchyma is noted suggesting medical renal disease. No hydronephrosis is noted currently. Electronically Signed   By: Lupita RaiderJames  Green Jr M.D.   On: 11/06/2019  09:48        Scheduled Meds: . divalproex  500 mg Oral Q12H  . enoxaparin (LOVENOX) injection  40 mg Subcutaneous Q24H  . feeding supplement (PRO-STAT SUGAR FREE 64)  30 mL Oral BID  . insulin aspart  1 Units Subcutaneous TID WC  . levETIRAcetam  1,500 mg  Oral BID  . metoprolol tartrate  25 mg Oral BID  . sodium chloride flush  10-40 mL Intracatheter Q12H   Continuous Infusions: . dextrose 100 mL/hr at 11/07/19 0913     LOS: 10 days    Time spent: over 68 min    Fayrene Helper, MD [facility] Pager AMION  If 7PM-7AM, please contact night-coverage [email or web address] Password [email or web address] 11/07/2019, 3:30 PM

## 2019-11-08 LAB — CBC WITH DIFFERENTIAL/PLATELET
Abs Immature Granulocytes: 0.08 10*3/uL — ABNORMAL HIGH (ref 0.00–0.07)
Basophils Absolute: 0 10*3/uL (ref 0.0–0.1)
Basophils Relative: 1 %
Eosinophils Absolute: 0.2 10*3/uL (ref 0.0–0.5)
Eosinophils Relative: 3 %
HCT: 44.2 % (ref 39.0–52.0)
Hemoglobin: 13.4 g/dL (ref 13.0–17.0)
Immature Granulocytes: 1 %
Lymphocytes Relative: 29 %
Lymphs Abs: 1.7 10*3/uL (ref 0.7–4.0)
MCH: 31.4 pg (ref 26.0–34.0)
MCHC: 30.3 g/dL (ref 30.0–36.0)
MCV: 103.5 fL — ABNORMAL HIGH (ref 80.0–100.0)
Monocytes Absolute: 0.6 10*3/uL (ref 0.1–1.0)
Monocytes Relative: 11 %
Neutro Abs: 3.1 10*3/uL (ref 1.7–7.7)
Neutrophils Relative %: 55 %
Platelets: 354 10*3/uL (ref 150–400)
RBC: 4.27 MIL/uL (ref 4.22–5.81)
RDW: 15.6 % — ABNORMAL HIGH (ref 11.5–15.5)
WBC: 5.7 10*3/uL (ref 4.0–10.5)
nRBC: 0 % (ref 0.0–0.2)

## 2019-11-08 LAB — COMPREHENSIVE METABOLIC PANEL
ALT: 42 U/L (ref 0–44)
AST: 22 U/L (ref 15–41)
Albumin: 2.9 g/dL — ABNORMAL LOW (ref 3.5–5.0)
Alkaline Phosphatase: 83 U/L (ref 38–126)
Anion gap: 13 (ref 5–15)
BUN: 41 mg/dL — ABNORMAL HIGH (ref 6–20)
CO2: 20 mmol/L — ABNORMAL LOW (ref 22–32)
Calcium: 9 mg/dL (ref 8.9–10.3)
Chloride: 109 mmol/L (ref 98–111)
Creatinine, Ser: 2.16 mg/dL — ABNORMAL HIGH (ref 0.61–1.24)
GFR calc Af Amer: 46 mL/min — ABNORMAL LOW (ref >60)
GFR calc non Af Amer: 39 mL/min — ABNORMAL LOW (ref >60)
Glucose, Bld: 92 mg/dL (ref 70–99)
Potassium: 4.2 mmol/L (ref 3.5–5.1)
Sodium: 142 mmol/L (ref 135–145)
Total Bilirubin: 0.6 mg/dL (ref 0.3–1.2)
Total Protein: 6.6 g/dL (ref 6.5–8.1)

## 2019-11-08 LAB — GLUCOSE, CAPILLARY
Glucose-Capillary: 65 mg/dL — ABNORMAL LOW (ref 70–99)
Glucose-Capillary: 98 mg/dL (ref 70–99)
Glucose-Capillary: 95 mg/dL (ref 70–99)

## 2019-11-08 LAB — MAGNESIUM: Magnesium: 2 mg/dL (ref 1.7–2.4)

## 2019-11-08 NOTE — Plan of Care (Signed)

## 2019-11-08 NOTE — Progress Notes (Addendum)
PROGRESS NOTE    Isaac Murphy  RCB:[id number] DOB: 1986/07/19 DOA: 10/28/2019 PCP: Fleet Contras, MD   Brief Narrative:  33 year old male with Down syndrome, seizure disorder, congenital suprapubic urethral opening, behavioral abnormalities on multiple medications who came in with lethargy, poor p.o. intake as well as a cough for several days.  He was febrile, hypoxic requiring 8 L, tested positive for Covid and was admitted to [facility]  Assessment & Plan:   Principal Problem:   Acute respiratory failure with hypoxia [facility]) Active Problems:   COVID-19   Seizure disorder (HCC)  Acute Hypoxic Respiratory Failure due to Covid-19 Viral Illness/pneumonia -Patient has been treated with remdesivir and completed a 5-day course, currently on steroids, clinically improving and he is on room air -Status post convalescent plasma on 10/30/2019 -will stop steroids -There was concern for aspiration pneumonia and he was started on Unasyn on 12/12, will complete a 7-day course  COVID-19 Labs  Recent Labs    11/07/19 0703  FERRITIN 244  CRP <0.5    No results found for: SARSCOV2NAA  Acute kidney injury, history of solitary kidney -Previous creatinine was 1.44 in 2019.  On presentation his creatinine was 3.7 with hyperkalemia and potassium of 5.5. -gradually improving, follow  -unable to measure output well, consider foley if needed -repeat US from 12/18 with no hydronephrosis noted -patient had an ultrasound of the kidney on 12/9 which showed mild right-sided hydronephrosis.  We will repeat renal ultrasound (as above) -Case was discussed with Dr. Marisue Humble of neurology  Hyperkalemia -improved, but persistent, repeat lokelma -will d/c steroids  Hypernatremia -Sodium gradually improving, 145 this AM -continue D5W at 100 cc/hr  Seizure disorder -Continue Depakote, Keppra  Suspected aspiration pneumonia -SLP evaluated, currently on dysphagia 2 diet with thin liquids   Agitation/confusion -Due to underlying Down syndrome, unfamiliar circumstances, possibly steroids contributing -As needed Ativan and Haldol  Sinus tachycardia - improving  T2DM with hypoglycemia: mild hypoglycemia this AM, but he's been on D5W.  Will need to d/c D5 and ensure he's able to do well off of this.  Sounds like PO intake during hospitalization has not been great (mom notes this maybe because not getting food he likes - hopefully this will improve at home).  DVT prophylaxis: lovenox Code Status: full  Family Communication: none at bedside - discussed with mother 12/18 and 12/19 Disposition Plan: pending improvement   Consultants:   none  Procedures:  Echo IMPRESSIONS    1. Left ventricular ejection fraction, by visual estimation, is 50 to 55%. The left ventricle has normal function. There is no left ventricular hypertrophy.  2. Left ventricular diastolic parameters are consistent with Grade I diastolic dysfunction (impaired relaxation).  3. The left ventricle has no regional wall motion abnormalities.  4. Global right ventricle has normal systolic function.The right ventricular size is normal.  5. Left atrial size was normal.  6. Right atrial size was normal.  7. Small pericardial effusion.  8. The mitral valve is normal in structure. No evidence of mitral valve regurgitation. No evidence of mitral stenosis.  9. The tricuspid valve is normal in structure. Tricuspid valve regurgitation is trivial. 10. The aortic valve is normal in structure. Aortic valve regurgitation is not visualized. No evidence of aortic valve sclerosis or stenosis. 11. The pulmonic valve was normal in structure. Pulmonic valve regurgitation is not visualized. 12. The inferior vena cava is normal in size with greater than 50% respiratory variability, suggesting right atrial pressure of 3 mmHg. 13. Technically  difficult; low normal LV systolic function; grade 1 diastolic dysfunction; oscillating  density in RA likely chiari network; small pericardial effusion.  Antimicrobials:  Anti-infectives (From admission, onward)   Start     Dose/Rate Route Frequency Ordered Stop   10/30/19 1600  Ampicillin-Sulbactam (UNASYN) 3 g in sodium chloride 0.9 % 100 mL IVPB  Status:  Discontinued     3 g 200 mL/hr over 30 Minutes Intravenous Every 8 hours 10/30/19 1559 11/06/19 1934   10/30/19 1000  remdesivir 100 mg in sodium chloride 0.9 % 100 mL IVPB     100 mg 200 mL/hr over 30 Minutes Intravenous Every 24 hours 10/28/19 2344 11/01/19 1045   10/29/19 2200  cefTRIAXone (ROCEPHIN) 1 g in sodium chloride 0.9 % 100 mL IVPB  Status:  Discontinued     1 g 200 mL/hr over 30 Minutes Intravenous Every 24 hours 10/28/19 2350 10/30/19 1559   10/29/19 0000  remdesivir 200 mg in sodium chloride 0.9% 250 mL IVPB     200 mg 580 mL/hr over 30 Minutes Intravenous Once 10/28/19 2344 10/29/19 0344   10/29/19 0000  azithromycin (ZITHROMAX) 500 mg in sodium chloride 0.9 % 250 mL IVPB  Status:  Discontinued     500 mg 250 mL/hr over 60 Minutes Intravenous Every 24 hours 10/28/19 2350 10/30/19 1115   10/29/19 0000  cefTRIAXone (ROCEPHIN) 1 g in sodium chloride 0.9 % 100 mL IVPB     1 g 200 mL/hr over 30 Minutes Intravenous  Once 10/28/19 2355 10/29/19 0120     Subjective: Difficult to understand.  Seems to nod affirmatively when I speak to him.  Objective: Vitals:   11/08/19 0400 11/08/19 0800 11/08/19 0841 11/08/19 1200  BP: 110/78 121/81 121/81 111/75  Pulse: 88 88  95  Resp: 20 18    Temp: 98.3 F (36.8 C) 98.1 F (36.7 C)  98.8 F (37.1 C)  TempSrc: Oral Oral  Oral  SpO2: 94% 98%  100%  Weight:      Height:        Intake/Output Summary (Last 24 hours) at 11/08/2019 1341 Last data filed at 11/08/2019 1100 Gross per 24 hour  Intake 3470.26 ml  Output 1275 ml  Net 2195.26 ml   Filed Weights   10/28/19 2315 10/29/19 0430  Weight: 74.5 kg 75 kg    Examination:  General: No acute distress.  Cardiovascular: RRR Lungs: unlabored Abdomen: Soft, nontender, nondistended Neurological: Alert disoriented.  Moving all extremities. Skin: Warm and dry. No rashes or lesions. Extremities: No clubbing or cyanosis. No edema.    Data Reviewed: I have personally reviewed following labs and imaging studies  CBC: Recent Labs  Lab 11/02/19 0634 11/05/19 0725 11/06/19 0437 11/07/19 0703 11/08/19 0439  WBC 9.8 11.0* 10.9* 7.9 5.7  NEUTROABS 7.3  --   --  5.1 3.1  HGB 13.4 16.2 14.0 12.8* 13.4  HCT 45.7 52.7* 45.4 41.8 44.2  MCV 107.0* 105.0* 103.2* 104.5* 103.5*  PLT 666* 575* 449* 313 063   Basic Metabolic Panel: Recent Labs  Lab 11/04/19 0055 11/05/19 0725 11/06/19 0437 11/07/19 0703 11/08/19 0439  NA 157* 151* 147* 145 142  K 5.0 6.2* 5.4* 4.3 4.2  CL 118* 115* 115* 112* 109  CO2 27 23 20* 23 20*  GLUCOSE 129* 131* 108* 97 92  BUN 61* 67* 62* 49* 41*  CREATININE 2.75* 2.90* 2.76* 2.33* 2.16*  CALCIUM 9.5 9.5 9.2 8.9 9.0  MG  --   --   --  2.2 2.0   GFR: Estimated Creatinine Clearance: 43 mL/min (A) (by C-G formula based on SCr of 2.16 mg/dL (H)). Liver Function Tests: Recent Labs  Lab 11/02/19 0634 11/06/19 0437 11/07/19 0703 11/08/19 0439  AST 41 31 24 22   ALT 43 75* 51* 42  ALKPHOS 92 98 80 83  BILITOT 0.6 0.9 0.4 0.6  PROT 8.2* 7.5 6.7 6.6  ALBUMIN 3.3* 3.1* 2.8* 2.9*   No results for input(s): LIPASE, AMYLASE in the last 168 hours. No results for input(s): AMMONIA in the last 168 hours. Coagulation Profile: No results for input(s): INR, PROTIME in the last 168 hours. Cardiac Enzymes: No results for input(s): CKTOTAL, CKMB, CKMBINDEX, TROPONINI in the last 168 hours. BNP (last 3 results) No results for input(s): PROBNP in the last 8760 hours. HbA1C: No results for input(s): HGBA1C in the last 72 hours. CBG: Recent Labs  Lab 11/07/19 0403 11/07/19 0744 11/07/19 1103 11/07/19 2208 11/08/19 0819  GLUCAP 93 73 71 108* 65*   Lipid Profile: No  results for input(s): CHOL, HDL, LDLCALC, TRIG, CHOLHDL, LDLDIRECT in the last 72 hours. Thyroid Function Tests: No results for input(s): TSH, T4TOTAL, FREET4, T3FREE, THYROIDAB in the last 72 hours. Anemia Panel: Recent Labs    11/07/19 0703  FERRITIN 244   Sepsis Labs: No results for input(s): PROCALCITON, LATICACIDVEN in the last 168 hours.  Recent Results (from the past 240 hour(s))  Urine culture     Status: None   Collection Time: 10/30/19  8:48 PM   Specimen: Urine, Random  Result Value Ref Range Status   Specimen Description   Final    URINE, RANDOM Performed at [facility], [address] [place], [place] [address]    Special Requests   Final    NONE Performed at [facility], [address] [place], [place] [address]    Culture   Final    NO GROWTH Performed at [facility], [address] [place], [place] [address]    Report Status 11/01/2019 FINAL  Final         Radiology Studies: No results found.      Scheduled Meds: . divalproex  500 mg Oral Q12H  . enoxaparin (LOVENOX) injection  40 mg Subcutaneous Q24H  . feeding supplement (PRO-STAT SUGAR FREE 64)  30 mL Oral BID  . insulin aspart  1 Units Subcutaneous TID WC  . levETIRAcetam  1,500 mg Oral BID  . metoprolol tartrate  25 mg Oral BID  . sodium chloride flush  10-40 mL Intracatheter Q12H   Continuous Infusions:    LOS: 11 days    Time spent: over 30 min    Lacretia Nicksaldwell Powell, MD [facility] Pager AMION  If 7PM-7AM, please contact night-coverage [email or web address] Password [email or web address] 11/08/2019, 1:41 PM

## 2019-11-08 NOTE — Progress Notes (Signed)
Patient in bilateral soft wrist restraints as ordered.

## 2019-11-09 LAB — GLUCOSE, CAPILLARY
Glucose-Capillary: 192 mg/dL — ABNORMAL HIGH (ref 70–99)
Glucose-Capillary: 66 mg/dL — ABNORMAL LOW (ref 70–99)
Glucose-Capillary: 156 mg/dL — ABNORMAL HIGH (ref 70–99)
Glucose-Capillary: 68 mg/dL — ABNORMAL LOW (ref 70–99)
Glucose-Capillary: 107 mg/dL — ABNORMAL HIGH (ref 70–99)
Glucose-Capillary: 66 mg/dL — ABNORMAL LOW (ref 70–99)
Glucose-Capillary: 70 mg/dL (ref 70–99)
Glucose-Capillary: 133 mg/dL — ABNORMAL HIGH (ref 70–99)
Glucose-Capillary: 84 mg/dL (ref 70–99)

## 2019-11-09 LAB — COMPREHENSIVE METABOLIC PANEL
ALT: 50 U/L — ABNORMAL HIGH (ref 0–44)
AST: 21 U/L (ref 15–41)
Albumin: 2.8 g/dL — ABNORMAL LOW (ref 3.5–5.0)
Alkaline Phosphatase: 92 U/L (ref 38–126)
Anion gap: 9 (ref 5–15)
BUN: 38 mg/dL — ABNORMAL HIGH (ref 6–20)
CO2: 24 mmol/L (ref 22–32)
Calcium: 9.1 mg/dL (ref 8.9–10.3)
Chloride: 109 mmol/L (ref 98–111)
Creatinine, Ser: 2.19 mg/dL — ABNORMAL HIGH (ref 0.61–1.24)
GFR calc Af Amer: 45 mL/min — ABNORMAL LOW (ref >60)
GFR calc non Af Amer: 39 mL/min — ABNORMAL LOW (ref >60)
Glucose, Bld: 86 mg/dL (ref 70–99)
Potassium: 4.5 mmol/L (ref 3.5–5.1)
Sodium: 142 mmol/L (ref 135–145)
Total Bilirubin: 0.5 mg/dL (ref 0.3–1.2)
Total Protein: 6.9 g/dL (ref 6.5–8.1)

## 2019-11-09 LAB — CBC WITH DIFFERENTIAL/PLATELET
Abs Immature Granulocytes: 0.09 10*3/uL — ABNORMAL HIGH (ref 0.00–0.07)
Basophils Absolute: 0 10*3/uL (ref 0.0–0.1)
Basophils Relative: 1 %
Eosinophils Absolute: 0.2 10*3/uL (ref 0.0–0.5)
Eosinophils Relative: 3 %
HCT: 43.5 % (ref 39.0–52.0)
Hemoglobin: 13.4 g/dL (ref 13.0–17.0)
Immature Granulocytes: 1 %
Lymphocytes Relative: 29 %
Lymphs Abs: 1.8 10*3/uL (ref 0.7–4.0)
MCH: 31.5 pg (ref 26.0–34.0)
MCHC: 30.8 g/dL (ref 30.0–36.0)
MCV: 102.4 fL — ABNORMAL HIGH (ref 80.0–100.0)
Monocytes Absolute: 1 10*3/uL (ref 0.1–1.0)
Monocytes Relative: 16 %
Neutro Abs: 3.1 10*3/uL (ref 1.7–7.7)
Neutrophils Relative %: 50 %
Platelets: 362 10*3/uL (ref 150–400)
RBC: 4.25 MIL/uL (ref 4.22–5.81)
RDW: 15.5 % (ref 11.5–15.5)
WBC: 6.3 10*3/uL (ref 4.0–10.5)
nRBC: 0 % (ref 0.0–0.2)

## 2019-11-09 LAB — MAGNESIUM: Magnesium: 2.2 mg/dL (ref 1.7–2.4)

## 2019-11-09 MED ORDER — DEXTROSE 50 % IV SOLN
12.5000 g | INTRAVENOUS | Status: AC
  Administered 2019-11-09: 07:00:00 12.5 g via INTRAVENOUS
  Filled 2019-11-09: qty 50

## 2019-11-09 MED ORDER — DEXTROSE 50 % IV SOLN
12.5000 g | INTRAVENOUS | Status: AC
  Administered 2019-11-09: 12.5 g via INTRAVENOUS
  Filled 2019-11-09: qty 50

## 2019-11-09 MED ORDER — METOPROLOL TARTRATE 25 MG PO TABS: 25.0000 mg | ORAL_TABLET | Freq: Two times a day (BID) | ORAL | 0 refills | Status: DC

## 2019-11-09 NOTE — TOC Progression Note (Signed)
Transition of Care [facility]) - Progression Note    Patient Details  Name: Donny Heffern MRN: [id number] Date of Birth: 1986-11-02  Transition [facility]) CM/SW Contact  Joaquin Courts, RN Phone Number: 11/09/2019, 1:04 PM  Clinical Narrative:    CM reached out to patient's mother to discuss discharge planning. Patient will return home.  Cm contacted all area agencies for HHPT/OT?SLP. Agencies contacted included  [facility], [facility], [facility], [place], [facility], [facility], [facility], and [facility], all agencies have declined. CM is unable to set up any [facility] services. Patient's mother and MD were notified of this fact. Mother expresses that she feels patient will get stronger as he continues to recover at home. [employer] was set up to deliver tub bench and Rollator to home, wheelchair is on national back order and unavailable at this time. [employer] rep states they will be able to provide a wheelchair to patient once they start receiving shipments again.  Patient will transport home by [facility].    Expected Discharge Plan: [place] Barriers to Discharge: Continued Medical Work up  Expected Discharge Plan and Services Expected Discharge Plan: [place]   Discharge Planning Services: CM Consult   Living arrangements for the past 2 months: Single Family Home Expected Discharge Date: 11/09/19               DME Arranged: Tub bench, Walker rolling with seat, Wheelchair manual DME Agency: [place] Date DME Agency Contacted: 11/09/19 Time DME Agency Contacted: [phone] Representative spoke with at DME Agency: Learta Codding [facility] Arranged: (unable to find agency to accept patient)           Social Determinants of Health (SDOH) Interventions    Readmission Risk Interventions No flowsheet data found.

## 2019-11-09 NOTE — Progress Notes (Signed)
SLP Cancellation Note  Patient Details Name: Isaac Murphy MRN: [id number] DOB: 04/18/1986   Cancelled treatment:       Reason Eval/Treat Not Completed: Patient declined, no reason specified. Attempted to offer POs, but pt consistently said no while turning away from SLP and covering his face. Attempted to provide encouragement, use music, help reposition. He does not want to eat or drink at this time. RN reported little intake at lunch but not overt problems with swallowing. Will continue to follow acutely, although note that he may discharge home today.   Venita Sheffield [name] 11/09/2019, 1:19 PM  Pollyann Glen, M.A. [place] Acute Environmental education officer [phone] Office [phone]

## 2019-11-09 NOTE — Discharge Summary (Signed)
Physician Discharge Summary  Isaac Murphy GNF:[id number] DOB: 22-Nov-1985 DOA: 10/28/2019  PCP: Fleet Contras, Murphy  Admit date: 10/28/2019 Discharge date: 11/09/2019  Time spent: 40 minutes  Recommendations for Outpatient Follow-up:  1. Follow outpatient CBC/CMP 2. Follow with nephrology outpatient  3. Attention to renal function and keppra dosing outpatient, may need adjustment if renal function does not continue to improve 4.  Continue quarantine per CDC guidelines 5. Continue modified diet 6. Patient with diabetes, A1c 6.6, follow outpatient 7. 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 intake as well as Isaac Murphy cough for several days. He was febrile, hypoxic requiring 8 L, tested positive for Covid and was admitted to [facility].  He was admitted with AHRF 2/2 COVID pneumonia.  He improved with steroids, remdesivir, convalescent plasma.  He was treated with abx for aspiration pneumonia.  He had AKI which has improved.    See below for further details  Hospital Course:  Acute Hypoxic Respiratory Failure due to Covid-19 Viral Illness/pneumonia -Patient has been treated with remdesivir and completed Isaac Murphy 5-day course, currently on steroids, clinically improving and he is on room air -Status post convalescent plasma on 10/30/2019 -will stop steroids -There was concern for aspiration pneumonia and he was started on Unasyn on 12/12, will complete  Isaac Murphy 7-day course - doing well on room air  COVID-19 Labs  Recent Labs    11/07/19 0703  FERRITIN 244  CRP <0.5    No results found for: SARSCOV2NAA  Acute kidney injury, history of solitary kidney -Previous creatinine was 1.44 in 2019. On presentation his creatinine was 3.7 with hyperkalemia and potassium of 5.5. -gradually improving, follow -> will need follow up outpatient with renal -unable to measure output well, consider foley if needed -repeat US from 12/18 with no hydronephrosis noted -patient had an ultrasound of the kidney on 12/9 which showed mild right-sided hydronephrosis. We will repeat renal ultrasound (as above) -Case was discussed with Dr. Marisue Humble of neprhology  Hyperkalemia -improved  Hypernatremia -improved  Seizure disorder -Continue Depakote, Keppra  Suspected aspiration pneumonia -SLP evaluated, currently on dysphagia 2 diet with thin liquids  Agitation/confusion -Due to underlying Down syndrome, unfamiliar circumstances, possibly steroids contributing -As needed Ativan and Haldol  Sinus tachycardia - improving  T2DM with hypoglycemia: he has diabetes, which is new, but BG have been low in previous mornings.  I think this may not have been accurate as BG's on his CMP have been consistently normal.  Follow outpatient with PCP.  Procedures: Echo IMPRESSIONS    1. Left ventricular ejection fraction, by visual estimation, is 50 to 55%. The left ventricle has normal function. There is no left ventricular hypertrophy.  2. Left ventricular diastolic parameters are consistent with Grade I diastolic dysfunction (impaired relaxation).  3. The left ventricle has no regional wall motion abnormalities.  4. Global right ventricle has normal systolic function.The right ventricular size is normal.  5. Left atrial size was normal.  6. Right atrial size was normal.  7.  Small pericardial effusion.  8. The mitral valve is normal in structure. No  evidence of mitral valve regurgitation. No evidence of mitral stenosis.  9. The tricuspid valve is normal in structure. Tricuspid valve regurgitation is trivial. 10. The aortic valve is normal in structure. Aortic valve regurgitation is not visualized. No evidence of aortic valve sclerosis or stenosis. 11. The pulmonic valve was normal in structure. Pulmonic valve regurgitation is not visualized. 12. The inferior vena cava is normal in size with greater than 50% respiratory variability, suggesting right atrial pressure of 3 mmHg. 13. Technically difficult; low normal LV systolic function; grade 1 diastolic dysfunction; oscillating density in RA likely chiari network; small pericardial effusion.  Consultations:  none  Discharge Exam: Vitals:   11/09/19 0800 11/09/19 1612  BP: 101/78   Pulse: 92 (!) 114  Resp: 16 18  Temp: 98.5 F (36.9 C) 99.4 F (37.4 C)  SpO2: 99% 96%   Difficult to communicate with due to downs syndrome, but seems comfortable, pleasant Discussed discharge plan of care with mom  General: No acute distress. Cardiovascular: RRR Lungs: Clear to auscultation bilaterally . Abdomen: Soft, nontender, nondistended Neurological: Alert and disoriented. Moves all extremities 4. Cranial nerves II through XII grossly intact. Skin: Warm and dry. No rashes or lesions. Extremities: No clubbing or cyanosis. No edema.  Discharge Instructions   Discharge Instructions    DIET DYS 2   Complete by: As directed    Diet recommendations: Dysphagia 2 (fine chop);Thin liquid Liquids provided via: (bottle) Medication Administration: Crushed with puree Supervision: Staff to assist with self feeding;Full supervision/cueing for compensatory strategies Compensations: Minimize environmental distractions;Slow rate;Small sips/bites;Lingual sweep for clearance of pocketing Postural Changes and/or Swallow Maneuvers: Seated upright 90 degrees   Fluid consistency: Thin   Diet - low sodium  heart healthy   Complete by: As directed    Diet - low sodium heart healthy   Complete by: As directed    Diet - low sodium heart healthy   Complete by: As directed    Discharge instructions   Complete by: As directed    You were seen for COVID 19 pneumonia, aspiration pneumonia, and acute kidney injury.  You improved with antibiotics, steroids, and convalescent plasma.  You had acute kidney injury, which has improved.  We do not know what your baseline creatinine is, but your creatinine has generally been improving over the past few days.  Please follow up with your primary care doctor as an outpatient.  You may need to see Isaac Murphy kidney doctor as an outpatient.  You had hydronephrosis of your kidney that seems to have resolved on repeat imaging.  Sometimes seizure medications may need to be adjusted for kidney function, it's important that you follow repeat labs and with your neurologist to determine if any changes should be made.  We're sending you home with Isaac Murphy dysphagia 2 (fine chop) diet.  We'll include instructions in the discharge regarding this.    We're sending home health PT, OT, and speech as well.  Yerick should quarantine until December 30th.  If his symptoms have resolved without the use of medications, his quarantine can be discontinued after the 30th.  Please call your PCP or the health department with questions.  Follow repeat chest x ray outpatient.  You have diabetes.  Please follow with your outpatient provider regarding this and possibly starting meds outpatient.  Return for new, recurrent, or worsening symptoms.  Please ask your PCP to request records from this hospitalization so they  know what was done and what the next steps will be.   For home use only DME 4 wheeled rolling walker with seat   Complete by: As directed    Patient needs Isaac Murphy walker to treat with the following condition: Down syndrome   Increase activity slowly   Complete by: As directed    Increase  activity slowly   Complete by: As directed    Increase activity slowly   Complete by: As directed      Allergies as of 11/09/2019   No Known Allergies     Medication List    TAKE these medications   clonazePAM 0.5 MG tablet Commonly known as: KLONOPIN Take 0.5 mg by mouth daily as needed.   cyclobenzaprine 5 MG tablet Commonly known as: FLEXERIL Take 5 mg by mouth 2 (two) times daily as needed.   divalproex 125 MG capsule Commonly known as: DEPAKOTE SPRINKLE Take 4 capsules (500 mg total) by mouth 2 (two) times daily.   levETIRAcetam 100 MG/ML solution Commonly known as: KEPPRA TAKE 15 MLS BY MOUTH 2 (TWO) TIMES DAILY. What changed: See the new instructions.   metoprolol tartrate 25 MG tablet Commonly known as: LOPRESSOR Take 1 tablet (25 mg total) by mouth 2 (two) times daily.   SUDAFED PO Take 1 tablet by mouth as needed (sinus).   triamcinolone cream 0.1 % Commonly known as: KENALOG Apply 1 application topically 2 (two) times daily as needed.   Vitamin D (Ergocalciferol) 1.25 MG (50000 UT) Caps capsule Commonly known as: DRISDOL Take 50,000 Units by mouth once Genetta Fiero week. Sundays            Durable Medical Equipment  (From admission, onward)         Start     Ordered   11/09/19 1241  DME Tub Bench  Once     12 /21/20 1244   11/09/19 1241  DME standard manual wheelchair with seat cushion  Once    Comments: Patient suffers from down syndrome which impairs their ability to perform daily activities like bathing, dressing, feeding and grooming in the home.  Isaac Murphy walker will not resolve issue with performing activities of daily living. Isaac Murphy wheelchair will allow patient to safely perform daily activities. Patient can safely propel the wheelchair in the home or has Isaac Murphy caregiver who can provide assistance. Length of need Lifetime. Accessories: elevating leg rests (ELRs), wheel locks, extensions and anti-tippers. 18" wide, 16" deep, hemi-height   11/09/19 1244   11/09/19  0000  For home use only DME 4 wheeled rolling walker with seat    Question:  Patient needs Milanya Sunderland walker to treat with the following condition  Answer:  Down syndrome   11/09/19 1244         No Known Allergies Follow-up Information    Fleet ContrasAvbuere, Edwin, Murphy Follow up.   Specialty: Internal Medicine Contact information: [address] [place] [address] [phone]            The results of significant diagnostics from this hospitalization (including imaging, microbiology, ancillary and laboratory) are listed below for reference.    Significant Diagnostic Studies: US RENAL  Result Date: 11/06/2019 CLINICAL DATA:  Acute kidney injury. EXAM: RENAL / URINARY TRACT ULTRASOUND COMPLETE COMPARISON:  October 29, 2019.  May 29, 2018. FINDINGS: Right Kidney: Renal measurements: 10.3 x 5.5 x 4.5 cm = volume: 135 mL. Increased echogenicity of renal parenchyma is noted. No mass or hydronephrosis visualized. Left Kidney: Not visualized. Bladder: Not visualized. Other: None.  IMPRESSION: Left kidney and urinary bladder are not visualized. Increased echogenicity of right renal parenchyma is noted suggesting medical renal disease. No hydronephrosis is noted currently. Electronically Signed   By: Lupita Raider M.D.   On: 11/06/2019 09:48   US RENAL  Result Date: 10/29/2019 CLINICAL DATA:  Acute renal failure EXAM: RENAL / URINARY TRACT ULTRASOUND COMPLETE COMPARISON:  CT dated May 29, 2018. FINDINGS: Right Kidney: Renal measurements: 10.3 by 4 x 3.4 cm = volume: 73 mL. There is increased cortical echogenicity. There is mild hydronephrosis. Left Kidney: The left kidney is not visualized, consistent with the patient's reported history of Isaac Murphy solitary right kidney. Bladder: Appears normal for degree of bladder distention. Other: None. IMPRESSION: 1. Mild right-sided hydronephrosis. 2. Echogenic right kidney which can be seen in patients with medical renal disease. 3. Nonvisualization of the left kidney.  Electronically Signed   By: Katherine Mantle M.D.   On: 10/29/2019 00:34   DG CHEST PORT 1 VIEW  Result Date: 10/31/2019 CLINICAL DATA:  33 year old male with Isaac Murphy history of pneumonia EXAM: PORTABLE CHEST 1 VIEW COMPARISON:  10/30/2019 FINDINGS: Cardiomediastinal silhouette unchanged in size and contour. Low lung volumes with patchy airspace/interstitial opacities bilaterally, unchanged. No pneumothorax. No large pleural effusion. Overlying EKG leads. IMPRESSION: Similar appearance of the chest x-ray with persisting bilateral mixed interstitial and airspace disease. Electronically Signed   By: Gilmer Mor D.O.   On: 10/31/2019 11:41   DG CHEST PORT 1 VIEW  Result Date: 10/30/2019 CLINICAL DATA:  Hypoxia. EXAM: PORTABLE CHEST 1 VIEW COMPARISON:  10/28/2019. FINDINGS: There are extensive bilateral airspace lung opacities, which appear more confluent in the bases than on the prior exam, now mostly obscuring the hemidiaphragms. No pneumothorax. IMPRESSION: 1. Worsened lung aeration when compared to the prior study with an interval increase in extensive bilateral airspace lung opacities, most evident at the bases. No other change. Electronically Signed   By: Amie [place] M.D.   On: 10/30/2019 14:10   DG Chest Port 1 View  Result Date: 10/28/2019 CLINICAL DATA:  Lethargy and weakness. EXAM: PORTABLE CHEST 1 VIEW COMPARISON:  11/10/2014 FINDINGS: The heart is enlarged and the mediastinal contours are prominent. Diffuse airspace process in the lungs worrisome for extensive bilateral pneumonia. No definite pleural effusions. The bony thorax is intact. IMPRESSION: 1. Diffuse airspace process in the lungs worrisome for extensive bilateral pneumonia. 2. No pleural effusions. Electronically Signed   By: Rudie Meyer M.D.   On: 10/28/2019 21:58   ECHOCARDIOGRAM COMPLETE  Result Date: 10/29/2019   ECHOCARDIOGRAM REPORT   Patient Name:   GARNETT NUNZIATA Date of Exam: 10/29/2019 Medical Rec #:  [id number]             Height:       61.0 in Accession #:    [id number]           Weight:       165.3 lb Date of Birth:  02-07-1986             BSA:          1.74 m Patient Age:    33 years             BP:           130/103 mmHg Patient Gender: M                    HR:           106 bpm. Exam Location:  Inpatient Procedure: 2D Echo Indications:    Tachycardia [[id number]]  History:        Patient has prior history of Echocardiogram examinations, most                 recent 07/04/2018. Signs/Symptoms:Shortness of Breath. COVID 19-                 [place]. Down syndrome.  Sonographer:    Leta Jungling RDCS Referring Phys: [phone] JAMES KIM IMPRESSIONS  1. Left ventricular ejection fraction, by visual estimation, is 50 to 55%. The left ventricle has normal function. There is no left ventricular hypertrophy.  2. Left ventricular diastolic parameters are consistent with Grade I diastolic dysfunction (impaired relaxation).  3. The left ventricle has no regional wall motion abnormalities.  4. Global right ventricle has normal systolic function.The right ventricular size is normal.  5. Left atrial size was normal.  6. Right atrial size was normal.  7. Small pericardial effusion.  8. The mitral valve is normal in structure. No evidence of mitral valve regurgitation. No evidence of mitral stenosis.  9. The tricuspid valve is normal in structure. Tricuspid valve regurgitation is trivial. 10. The aortic valve is normal in structure. Aortic valve regurgitation is not visualized. No evidence of aortic valve sclerosis or stenosis. 11. The pulmonic valve was normal in structure. Pulmonic valve regurgitation is not visualized. 12. The inferior vena cava is normal in size with greater than 50% respiratory variability, suggesting right atrial pressure of 3 mmHg. 13. Technically difficult; low normal LV systolic function; grade 1 diastolic dysfunction; oscillating density in RA likely chiari network; small pericardial effusion. FINDINGS  Left  Ventricle: Left ventricular ejection fraction, by visual estimation, is 50 to 55%. The left ventricle has normal function. The left ventricle has no regional wall motion abnormalities. There is no left ventricular hypertrophy. Left ventricular diastolic parameters are consistent with Grade I diastolic dysfunction (impaired relaxation). Normal left atrial pressure. Right Ventricle: The right ventricular size is normal.Global RV systolic function is has normal systolic function. Left Atrium: Left atrial size was normal in size. Right Atrium: Right atrial size was normal in size Prominent Chiari network. Pericardium: Lalitha Ilyas small pericardial effusion is present. Mitral Valve: The mitral valve is normal in structure. No evidence of mitral valve regurgitation. No evidence of mitral valve stenosis by observation. Tricuspid Valve: The tricuspid valve is normal in structure. Tricuspid valve regurgitation is trivial. Aortic Valve: The aortic valve is normal in structure. Aortic valve regurgitation is not visualized. The aortic valve is structurally normal, with no evidence of sclerosis or stenosis. Pulmonic Valve: The pulmonic valve was normal in structure. Pulmonic valve regurgitation is not visualized. Pulmonic regurgitation is not visualized. Aorta: The aortic root is normal in size and structure. Venous: The inferior vena cava is normal in size with greater than 50% respiratory variability, suggesting right atrial pressure of 3 mmHg.  Additional Comments: Technically difficult; low normal LV systolic function; grade 1 diastolic dysfunction; oscillating density in RA likely chiari network; small pericardial effusion.  LEFT VENTRICLE PLAX 2D LVIDd:         4.02 cm  Diastology LVIDs:         2.97 cm  LV e' lateral:   8.05 cm/s LV PW:         0.80 cm  LV E/e' lateral: 10.5 LV IVS:        1.08 cm  LV e' medial:    8.81 cm/s LVOT diam:  2.20 cm  LV E/e' medial:  9.6 LV SV:         37 ml LV SV Index:   20.09 LVOT Area:     3.80  cm  RIGHT VENTRICLE RV S prime:     10.70 cm/s LEFT ATRIUM             Index LA diam:        2.50 cm 1.44 cm/m LA Vol (A2C):   20.4 ml 11.71 ml/m LA Vol (A4C):   12.2 ml 7.00 ml/m LA Biplane Vol: 16.9 ml 9.70 ml/m  AORTIC VALVE LVOT Vmax:   68.30 cm/s LVOT Vmean:  43.500 cm/s LVOT VTI:    0.105 m  AORTA Ao Root diam: 3.40 cm Ao Asc diam:  2.60 cm MITRAL VALVE MV Area (PHT): 6.07 cm              SHUNTS MV PHT:        36.25 msec            Systemic VTI:  0.10 m MV Decel Time: 125 msec              Systemic Diam: 2.20 cm MV E velocity: 84.20 cm/s  103 cm/s MV Kailea Dannemiller velocity: 100.00 cm/s 70.3 cm/s MV E/Pepe Mineau ratio:  0.84        1.5  Isaac Murphy Electronically signed by Isaac Murphy Signature Date/Time: 10/29/2019/12:13:21 PM    Final     Microbiology: Recent Results (from the past 240 hour(s))  Urine culture     Status: None   Collection Time: 10/30/19  8:48 PM   Specimen: Urine, Random  Result Value Ref Range Status   Specimen Description   Final    URINE, RANDOM Performed at [facility], [place] [address] [place], [place] [address]    Special Requests   Final    NONE Performed at [facility], [place] [address] [place], [place] [address]    Culture   Final    NO GROWTH Performed at [facility], [place] [address] [place], [place] [address]    Report Status 11/01/2019 FINAL  Final     Labs: Basic Metabolic Panel: Recent Labs  Lab 11/05/19 0725 11/06/19 0437 11/07/19 0703 11/08/19 0439 11/09/19 0547  NA 151* 147* 145 142 142  K 6.2* 5.4* 4.3 4.2 4.5  CL 115* 115* 112* 109 109  CO2 23 20* 23 20* 24  GLUCOSE 131* 108* 97 92 86  BUN 67* 62* 49* 41* 38*  CREATININE 2.90* 2.76* 2.33* 2.16* 2.19*  CALCIUM 9.5 9.2 8.9 9.0 9.1  MG  --   --  2.2 2.0 2.2   Liver Function Tests: Recent Labs  Lab 11/06/19 0437 11/07/19 0703 11/08/19 0439 11/09/19 0547  AST 31 24 22 21   ALT 75* 51* 42 50*  ALKPHOS 98 80 83 92  BILITOT 0.9 0.4 0.6 0.5   PROT 7.5 6.7 6.6 6.9  ALBUMIN 3.1* 2.8* 2.9* 2.8*   No results for input(s): LIPASE, AMYLASE in the last 168 hours. No results for input(s): AMMONIA in the last 168 hours. CBC: Recent Labs  Lab 11/05/19 0725 11/06/19 0437 11/07/19 0703 11/08/19 0439 11/09/19 0547  WBC 11.0* 10.9* 7.9 5.7 6.3  NEUTROABS  --   --  5.1 3.1 3.1  HGB 16.2 14.0 12.8* 13.4 13.4  HCT 52.7* 45.4 41.8 44.2 43.5  MCV 105.0* 103.2* 104.5* 103.5* 102.4*  PLT 575* 449* 313 354  362   Cardiac Enzymes: No results for input(s): CKTOTAL, CKMB, CKMBINDEX, TROPONINI in the last 168 hours. BNP: BNP (last 3 results) No results for input(s): BNP in the last 8760 hours.  ProBNP (last 3 results) No results for input(s): PROBNP in the last 8760 hours.  CBG: Recent Labs  Lab 11/09/19 0108 11/09/19 0632 11/09/19 0703 11/09/19 1237 11/09/19 1611  GLUCAP 192* 68* 156* 107* 133*       Signed:  Lacretia Nicks Murphy.  [facility] 11/09/2019, 6:44 PM

## 2019-11-09 NOTE — Progress Notes (Signed)
Occupational Therapy Treatment Patient Details Name: Isaac Murphy MRN: [id number] DOB: 1985/12/11 Today's Date: 11/09/2019    History of present illness 33 y.o. male with h/o Down syndrome, seizure disorder, on multiple meds, lives at home with his mother, brought to ED with lethargy and weakness. Dx with COVID-19 and transferred to [facility]; acute hypoxic respiratory failure, AKI    OT comments  Pt more lethargic this session. Nsg reports pt was given meds for pain which may be affecting his level of arousal. Pt incontinent of BM and required Max A to clean however pt was able to use BSC. PT only wiling to take a coupe of steps to chair. Pt asking to rol around in hall to see people. Used music to engage pt in dancing/exercise while in hallway. Pt declined any food/drink this session. Feel pt will do best in familiar home environment with [facility] services, however note HH is not able to be set up. Will attempt to contact Mom in am to discuss safety with ADL and mobility.   Follow Up Recommendations  Home health OT;Supervision/Assistance - 24 hour    Equipment Recommendations  Tub/shower bench;Wheelchair (measurements OT);Wheelchair cushion (measurements OT)    Recommendations for Other Services PT consult    Precautions / Restrictions Precautions Precautions: Fall       Mobility Bed Mobility Overal bed mobility: Needs Assistance             General bed mobility comments: Pt would not initiate getting OOB. Bed  pad used to mobilize to Marquies Wanat EOB, then pt began to assist  Transfers Overall transfer level: Needs assistance   Transfers: Sit to/from Stand;Stand Pivot Transfers Sit to Stand: Min assist Stand pivot transfers: Min assist            Balance     Sitting balance-Leahy Scale: Fair       Standing balance-Leahy Scale: Poor                             ADL either performed or assessed with clinical judgement   ADL Overall ADL's : Needs  assistance/impaired   Eating/Feeding Details (indicate cue type and reason): Pt would not drink waer or eat during session                           Toileting - Clothing Manipulation Details (indicate cue type and reason): incontinenet of BM; total A to clean     Functional mobility during ADLs: +2 for physical assistance;Minimal assistance;Rolling walker;Cueing for safety       Vision       Perception     Praxis      Cognition Arousal/Alertness: Awake/alert Behavior During Therapy: Restless;Flat affect Overall Cognitive Status: History of cognitive impairments - at baseline                                          Exercises     Shoulder Instructions       General Comments      Pertinent Vitals/ Pain       Pain Assessment: Faces Faces Pain Scale: Hurts little more Pain Location: holding head Pain Descriptors / Indicators: Discomfort;Grimacing Pain Intervention(s): Limited activity within patient's tolerance  Home Living  Prior Functioning/Environment              Frequency  Min 3X/week        Progress Toward Goals  OT Goals(current goals can now be found in the care plan section)  Progress towards OT goals: Progressing toward goals  Acute Rehab OT Goals Patient Stated Goal: pt unable to state goals OT Goal Formulation: With family Time For Goal Achievement: 11/16/19 Potential to Achieve Goals: Good ADL Goals Pt Will Perform Grooming: with set-up;with supervision;sitting Pt Will Perform Upper Body Dressing: with set-up;with supervision;sitting Pt Will Perform Lower Body Dressing: with min assist;sit to/from stand Pt Will Transfer to Toilet: with min assist;bedside commode;ambulating Pt Will Perform Toileting - Clothing Manipulation and hygiene: with min assist;sit to/from stand;sitting/lateral leans Additional ADL Goal #1: Pt will perform bed mobility with Min  A in preparation for ADLs Additional ADL Goal #2: Pt will sustain attention to simple ADL with min cues  Plan Discharge plan remains appropriate    Co-evaluation                 AM-PAC OT "6 Clicks" Daily Activity     Outcome Measure   Help from another person eating meals?: A Little Help from another person taking care of personal grooming?: A Little Help from another person toileting, which includes using toliet, bedpan, or urinal?: A Lot Help from another person bathing (including washing, rinsing, drying)?: A Lot Help from another person to put on and taking off regular upper body clothing?: A Little Help from another person to put on and taking off regular lower body clothing?: A Lot 6 Click Score: 15    End of Session Equipment Utilized During Treatment: Rolling walker  OT Visit Diagnosis: Unsteadiness on feet (R26.81);Other abnormalities of gait and mobility (R26.89);Muscle weakness (generalized) (M62.81)   Activity Tolerance Patient tolerated treatment well   Patient Left in chair;with call bell/phone within reach;with chair alarm set   Nurse Communication Mobility status        Time: 1106-1200 OT Time Calculation (min): 54 min  Charges: OT General Charges $OT Visit: 1 Visit OT Treatments $Self Care/Home Management : 53-67 mins  Luisa Dago, OT/L   Acute OT Clinical Specialist Acute Rehabilitation Services Pager [phone] Office [phone]    [facility] 11/09/2019, 6:32 PM

## 2019-11-09 NOTE — Plan of Care (Signed)
Careplans complete ° °

## 2019-11-09 NOTE — Discharge Instructions (Signed)
Isaac Murphy should quarantine until December 30th.  If his symptoms have resolved without the use of medications, his quarantine Murphy be discontinued after the 30th.  Please call your PCP or the health department with questions.     Person Under Monitoring Name: Isaac Murphy  Location[place] [address] [place] [address]   Infection Prevention Recommendations for Individuals Confirmed to have, or Being Evaluated for, 2019 Novel Coronavirus (COVID-19) Infection Who Receive Care at Home  Individuals who are confirmed to have, or are being evaluated for, COVID-19 should follow the prevention steps below until Isaac Murphy healthcare provider or local or state health department says they Murphy return to normal activities.  Stay home except to get medical care You should restrict activities outside your home, except for getting medical care. Do not go to work, school, or public areas, and do not use public transportation or taxis.  Call ahead before visiting your doctor Before your medical appointment, call the healthcare provider and tell them that you have, or are being evaluated for, COVID-19 infection. This will help the healthcare provider's office take steps to keep other people from getting infected. Ask your healthcare provider to call the local or state health department.  Monitor your symptoms Seek prompt medical attention if your illness is worsening (e.g., difficulty breathing). Before going to your medical appointment, call the healthcare provider and tell them that you have, or are being evaluated for, COVID-19 infection. Ask your healthcare provider to call the local or state health department.  Wear Isaac Murphy facemask You should wear Isaac Murphy facemask that covers your nose and mouth when you are in the same room with other people and when you visit Isaac Murphy healthcare provider. People who live with or visit you should also wear Isaac Murphy facemask while they are in the same room with you.  Separate yourself  from other people in your home As much as possible, you should stay in Isaac Murphy different room from other people in your home. Also, you should use Isaac Murphy separate bathroom, if available.  Avoid sharing household items You should not share dishes, drinking glasses, cups, eating utensils, towels, bedding, or other items with other people in your home. After using these items, you should wash them thoroughly with soap and water.  Cover your coughs and sneezes Cover your mouth and nose with Isaac Murphy tissue when you cough or sneeze, or you Murphy cough or sneeze into your sleeve. Throw used tissues in Isaac Murphy, and immediately wash your hands with soap and water for at least 20 seconds or use an alcohol-based hand rub.  Wash your Isaac Murphy Wash your hands often and thoroughly with soap and water for at least 20 seconds. You Murphy use an alcohol-based hand sanitizer if soap and water are not available and if your hands are not visibly dirty. Avoid touching your eyes, nose, and mouth with unwashed hands.   Prevention Steps for Caregivers and Household Members of Individuals Confirmed to have, or Being Evaluated for, COVID-19 Infection Being Cared for in the Home  If you live with, or provide care at home for, Isaac Murphy person confirmed to have, or being evaluated for, COVID-19 infection please follow these guidelines to prevent infection:  Follow healthcare provider's instructions Make sure that you understand and Murphy help the patient follow any healthcare provider instructions for all care.  Provide for the patient's basic needs You should help the patient with basic needs in the home and provide support for getting groceries, prescriptions, and other personal  needs.  Monitor the patient's symptoms If they are getting sicker, call his or her medical provider and tell them that the patient has, or is being evaluated for, COVID-19 infection. This will help the healthcare provider's office take steps to keep other  people from getting infected. Ask the healthcare provider to call the local or state health department.  Limit the number of people who have contact with the patient  If possible, have only one caregiver for the patient.  Other household members should stay in another home or place of residence. If this is not possible, they should stay  in another room, or be separated from the patient as much as possible. Use Isaac Murphy separate bathroom, if available.  Restrict visitors who do not have an essential need to be in the home.  Keep older adults, very young children, and other sick people away from the patient Keep older adults, very young children, and those who have compromised immune systems or chronic health conditions away from the patient. This includes people with chronic heart, lung, or kidney conditions, diabetes, and cancer.  Ensure good ventilation Make sure that shared spaces in the home have good air flow, such as from an air conditioner or an opened window, weather permitting.  Wash your hands often  Wash your hands often and thoroughly with soap and water for at least 20 seconds. You Murphy use an alcohol based hand sanitizer if soap and water are not available and if your hands are not visibly dirty.  Avoid touching your eyes, nose, and mouth with unwashed hands.  Use disposable paper towels to dry your hands. If not available, use dedicated cloth towels and replace them when they become wet.  Wear Isaac Murphy facemask and gloves  Wear Isaac Murphy disposable facemask at all times in the room and gloves when you touch or have contact with the patient's blood, body fluids, and/or secretions or excretions, such as sweat, saliva, sputum, nasal mucus, vomit, urine, or feces.  Ensure the mask fits over your nose and mouth tightly, and do not touch it during use.  Throw out disposable facemasks and gloves after using them. Do not reuse.  Wash your hands immediately after removing your facemask and  gloves.  If your personal clothing becomes contaminated, carefully remove clothing and launder. Wash your hands after handling contaminated clothing.  Place all used disposable facemasks, gloves, and other waste in Rafik Koppel lined container before disposing them with other household waste.  Remove gloves and wash your hands immediately after handling these items.  Do not share dishes, glasses, or other household items with the patient  Avoid sharing household items. You should not share dishes, drinking glasses, cups, eating utensils, towels, bedding, or other items with Rheannon Cerney patient who is confirmed to have, or being evaluated for, COVID-19 infection.  After the person uses these items, you should wash them thoroughly with soap and water.  Wash laundry thoroughly  Immediately remove and wash clothes or bedding that have blood, body fluids, and/or secretions or excretions, such as sweat, saliva, sputum, nasal mucus, vomit, urine, or feces, on them.  Wear gloves when handling laundry from the patient.  Read and follow directions on labels of laundry or clothing items and detergent. In general, wash and dry with the warmest temperatures recommended on the label.  Clean all areas the individual has used often  Clean all touchable surfaces, such as counters, tabletops, doorknobs, bathroom fixtures, toilets, phones, keyboards, tablets, and bedside tables, every day. Also, clean any  surfaces that may have blood, body fluids, and/or secretions or excretions on them.  Wear gloves when cleaning surfaces the patient has come in contact with.  Use Kylieann Eagles diluted bleach solution (e.g., dilute bleach with 1 part bleach and 10 parts water) or Debara Kamphuis household disinfectant with Sharlot Sturkey label that says EPA-registered for coronaviruses. To make Chenita Ruda bleach solution at home, add 1 tablespoon of bleach to 1 quart (4 cups) of water. For Faraaz Wolin larger supply, add  cup of bleach to 1 gallon (16 cups) of water.  Read labels of cleaning  products and follow recommendations provided on product labels. Labels contain instructions for safe and effective use of the cleaning product including precautions you should take when applying the product, such as wearing gloves or eye protection and making sure you have good ventilation during use of the product.  Remove gloves and wash hands immediately after cleaning.  Monitor yourself for signs and symptoms of illness Caregivers and household members are considered close contacts, should monitor their health, and will be asked to limit movement outside of the home to the extent possible. Follow the monitoring steps for close contacts listed on the symptom monitoring form.   ? If you have additional questions, contact your local health department or call the epidemiologist on call at [phone] (available 24/7). ? This guidance is subject to change. For the most up-to-date guidance from [facility], please refer to their website: [employer]Met[employer].hu

## 2019-11-10 LAB — GLUCOSE, CAPILLARY
Glucose-Capillary: 112 mg/dL — ABNORMAL HIGH (ref 70–99)
Glucose-Capillary: 94 mg/dL (ref 70–99)
Glucose-Capillary: 78 mg/dL (ref 70–99)
Glucose-Capillary: 84 mg/dL (ref 70–99)

## 2019-11-10 NOTE — Progress Notes (Signed)
DISCHARGE Patient discharged to home, patient transferred with PTAR via stretcher, mother contacted about patient's discharge at this time, patient is alert and on room air.

## 2020-01-17 ENCOUNTER — Other Ambulatory Visit: Payer: Self-pay | Admitting: Diagnostic Neuroimaging

## 2020-01-18 NOTE — Telephone Encounter (Signed)
Called [facility], spoke with Kennedy Bucker in pharmacy to clarify refill requested amount of mL. The previous Rx was for 946 mL because of insurance coverage. Kennedy Bucker stated the last 2 refills picked up were for 2838 mL. Kennedy Bucker state dif insurance won't cover that amount they will fill for 946 mL. Will send Rx refill back in. Called mother and scheduled FU for Thurs, advised her that levetiracetam was refilled x 3 months;  he must be seen for further refills. She stated he has gained more weight, is havving more staring spells,  verbalized understanding, appreciation.

## 2020-01-21 ENCOUNTER — Ambulatory Visit (INDEPENDENT_AMBULATORY_CARE_PROVIDER_SITE_OTHER): Payer: Medicaid Other | Admitting: Family Medicine

## 2020-01-21 ENCOUNTER — Other Ambulatory Visit: Payer: Self-pay

## 2020-01-21 ENCOUNTER — Encounter: Payer: Self-pay | Admitting: Family Medicine

## 2020-01-21 VITALS — BP 160/100 | HR 92 | Ht 61.0 in | Wt 168.0 lb

## 2020-01-21 DIAGNOSIS — G40209 Localization-related (focal) (partial) symptomatic epilepsy and epileptic syndromes with complex partial seizures, not intractable, without status epilepticus: Secondary | ICD-10-CM

## 2020-01-21 MED ORDER — LEVETIRACETAM 100 MG/ML PO SOLN: ORAL | 0 refills | Status: DC

## 2020-01-21 MED ORDER — DIVALPROEX SODIUM 125 MG PO CSDR: 625.0000 mg | DELAYED_RELEASE_CAPSULE | Freq: Two times a day (BID) | ORAL | 3 refills | Status: DC

## 2020-01-21 NOTE — Patient Instructions (Signed)
We will increase divalproex (Depakote) to 625mg  (5 capsules) twice daily  Continue levetiracetam 44ml twice daily  Stay well hydrated  Follow up closely with PCP  Follow up with 12m in 6 months, sooner if needed   Seizure, Adult A seizure is a sudden burst of abnormal electrical activity in the brain. Seizures usually last from 30 seconds to 2 minutes. They can cause many different symptoms. Usually, seizures are not harmful unless they last a long time. What are the causes? Common causes of this condition include:  Fever or infection.  Conditions that affect the brain, such as: ? A brain abnormality that you were born with. ? A brain or head injury. ? Bleeding in the brain. ? A tumor. ? Stroke. ? Brain disorders such as autism or cerebral palsy.  Low blood sugar.  Conditions that are passed from parent to child (are inherited).  Problems with substances, such as: ? Having a reaction to a drug or a medicine. ? Suddenly stopping the use of a substance (withdrawal). In some cases, the cause may not be known. A person who has repeated seizures over time without a clear cause has a condition called epilepsy. What increases the risk? You are more likely to get this condition if you have:  A family history of epilepsy.  Had a seizure in the past.  A brain disorder.  A history of head injury, lack of oxygen at birth, or strokes. What are the signs or symptoms? There are many types of seizures. The symptoms vary depending on the type of seizure you have. Examples of symptoms during a seizure include:  Shaking (convulsions).  Stiffness in the body.  Passing out (losing consciousness).  Head nodding.  Staring.  Not responding to sound or touch.  Loss of bladder control and bowel control. Some people have symptoms right before and right after a seizure happens. Symptoms before a seizure may include:  Fear.  Worry (anxiety).  Feeling like you may vomit  (nauseous).  Feeling like the room is spinning (vertigo).  Feeling like you saw or heard something before (dj vu).  Odd tastes or smells.  Changes in how you see. You may see flashing lights or spots. Symptoms after a seizure happens can include:  Confusion.  Sleepiness.  Headache.  Weakness on one side of the body. How is this treated? Most seizures will stop on their own in under 5 minutes. In these cases, no treatment is needed. Seizures that last longer than 5 minutes will usually need treatment. Treatment can include:  Medicines given through an IV tube.  Avoiding things that are known to cause your seizures. These can include medicines that you take for another condition.  Medicines to treat epilepsy.  Surgery to stop the seizures. This may be needed if medicines do not help. Follow these instructions at home: Medicines  Take over-the-counter and prescription medicines only as told by your doctor.  Do not eat or drink anything that may keep your medicine from working, such as alcohol. Activity  Do not do any activities that would be dangerous if you had another seizure, like driving or swimming. Wait until your doctor says it is safe for you to do them.  If you live in the [place] ask your local DMV (department of motor vehicles) when you can drive.  Get plenty of rest. Teaching others Teach friends and family what to do when you have a seizure. They should:  Lay you on the ground.  Protect your  head and body.  Loosen any tight clothing around your neck.  Turn you on your side.  Not hold you down.  Not put anything into your mouth.  Know whether or not you need emergency care.  Stay with you until you are better.  General instructions  Contact your doctor each time you have a seizure.  Avoid anything that gives you seizures.  Keep a seizure diary. Write down: ? What you think caused each seizure. ? What you remember about each seizure.  Keep  all follow-up visits as told by your doctor. This is important. Contact a doctor if:  You have another seizure.  You have seizures more often.  There is any change in what happens during your seizures.  You keep having seizures with treatment.  You have symptoms of being sick or having an infection. Get help right away if:  You have a seizure that: ? Lasts longer than 5 minutes. ? Is different than seizures you had before. ? Makes it harder to breathe. ? Happens after you hurt your head.  You have any of these symptoms after a seizure: ? Not being able to speak. ? Not being able to use a part of your body. ? Confusion. ? A bad headache.  You have two or more seizures in a row.  You do not wake up right after a seizure.  You get hurt during a seizure. These symptoms may be an emergency. Do not wait to see if the symptoms will go away. Get medical help right away. Call your local emergency services (911 in the [place] Do not drive yourself to the hospital. Summary  Seizures usually last from 30 seconds to 2 minutes. Usually, they are not harmful unless they last a long time.  Do not eat or drink anything that may keep your medicine from working, such as alcohol.  Teach friends and family what to do when you have a seizure.  Contact your doctor each time you have a seizure. This information is not intended to replace advice given to you by your health care provider. Make sure you discuss any questions you have with your health care provider. Document Revised: 01/23/2019 Document Reviewed: 01/23/2019 Elsevier Patient Education  [place].

## 2020-01-21 NOTE — Progress Notes (Signed)
PATIENT: Isaac Murphy DOB: 1986-04-05  REASON FOR VISIT: follow up HISTORY FROM: patient  Chief Complaint  Patient presents with  . Follow-up    RM 2, with mother. Having "little" seizures. Blanks out-aggression after seizures.     HISTORY OF PRESENT ILLNESS: Today 01/21/20 Isaac Murphy is a 34 y.o. male here today for follow up for seizures. We increased divalproex to 500mg  twice daily last year. He was to continue levetiracetam 1500mg  BID and follow up in 6 months. Mom feels that over the past year, he has had more events where he will "black out" for a couple of seconds. He is unable to communicate. She can see "jerking" for a few seconds then it stops. Sometimes he falls over or will drop an object with event. Mom reports that he has an episode almost daily. He seems irritable following event. He was admitted to [facility] in 10/2019 for COVID pneumonia. He has been followed by PCP closely following hospitalization and mom reports he is doing well.    HISTORY: (copied from my note on 01/15/2019)  Isaac Murphy is a 34 y.o. male here today for follow up for seizures. He has had several seizures since last being seen. He had a convulsive seizure in September and November 2019. Another on 01/10/19. She reported he's havingspells where "hegoesinto a daze anddrops things." He has had similar events in the past but she has always been able to talk him through it. She has noticed this is happening about 1-2 times a day. Event lasts just a couple of seconds. He has not been sick or missed any doses of medications. He is currently taking levetiracetam 1500mg  BID and divalproex 375mg  BID. Mom reports that he recently had labs drawn with PCP but is uncertain of results.    HISTORY: (copied from Dr December 2019 note on 01/13/2018) UPDATE (01/13/18, VRP): Since last visit, doingwell. Toleratinghigher dose of depakote. No alleviating or aggravating factors.Some increased  weight. No seizures since Oct 2018.  UPDATE (09/11/17, MM):34 year old male with a history of seizure events. He returns today for an evaluation. His mom reports that he had a seizure on Saturday. Reports that it was a generalized seizure. He had convulsing in the extremities with loss of consciousness. She states that histongue was also purple. The seizure lasted approximately 3 minutes. She is unsure if he lostbowels orbladder as he wears depends regularly. She denies missing any doses. She does state that the power was outThursday and Friday. Thereforethe patient did not sleep much because of this. He returns today for an evaluation.  UPDATE 02/20/17: Since last visit, had 1 seizure in Feb 2018; mother saw patient him after seizure and patient breathing heavy and seemed more confusion. No triggering factors.   UPDATE 08/07/16: Since last visit, doing well. No more seizures since starting depakote sprinkles.   UPDATE 03/19/16: Since last visit, was doing well until Feb 2017, then had 4 seizures since that time. No triggering factors. Sz were staring then convulsive.  UPDATE 09/26/15: Since last visit, was doing well until 09/17/15 (breakthrough sz). Tolerating incr LEV 1000mg  BID dose.   UPDATE 06/23/15: Since last visit, was doing well until Feb 2016 (had 2nd seizure). Then went to ER and started on LEV 500mg  BID. Then had addl sz in April, June and July 2016. Some warning, as patient would go to sofa and brace himself, then mouth and face twitching, then generalized convulsions, gasping for breath, then stopped. Confusion for 1-2 hours,  then returns to baseline. Tolerating LEV.   PRIOR HPI (11/16/14): 34 year old male with history of Down syndrome, congenital solitary kidney, here for evaluation of seizure. No right or history of seizures until 11/10/14, patient was eating breakfast when all of a sudden he made gurgling sounds, had convulsions, fell out of his chair. Seizure lasted for 5  minutes. Patient did have tongue biting with the seizure. He was confused, dazed, for another 15-20 minutes. Patient was gradually returned to baseline by the time he was loaded into the ambulance and came to the emergency room. No recent sleep changes, infections, stresses, traumas.     REVIEW OF SYSTEMS: Out of a complete 14 system review of symptoms, the patient complains only of the following symptoms, seizures and all other reviewed systems are negative.  ALLERGIES: No Known Allergies  HOME MEDICATIONS: Outpatient Medications Prior to Visit  Medication Sig Dispense Refill  . clonazePAM (KLONOPIN) 0.5 MG tablet Take 0.5 mg by mouth daily as needed.    . cyclobenzaprine (FLEXERIL) 5 MG tablet Take 5 mg by mouth 2 (two) times daily as needed.    . Pseudoephedrine HCl (SUDAFED PO) Take 1 tablet by mouth as needed (sinus).    . triamcinolone cream (KENALOG) 0.1 % Apply 1 application topically 2 (two) times daily as needed.    . Vitamin D, Ergocalciferol, (DRISDOL) 1.25 MG (50000 UT) CAPS capsule Take 50,000 Units by mouth once a week. Sundays    . divalproex (DEPAKOTE SPRINKLE) 125 MG capsule Take 4 capsules (500 mg total) by mouth 2 (two) times daily. 560 capsule 4  . levETIRAcetam (KEPPRA) 100 MG/ML solution TAKE 15 MLS BY MOUTH 2 (TWO) TIMES DAILY. 2838 mL 0  . metoprolol tartrate (LOPRESSOR) 25 MG tablet Take 1 tablet (25 mg total) by mouth 2 (two) times daily. 60 tablet 0   No facility-administered medications prior to visit.    PAST MEDICAL HISTORY: Past Medical History:  Diagnosis Date  . Down syndrome   . H/O unilateral nephrectomy   . Seizures (HCC) 12/2014   last sz 03/18/16    PAST SURGICAL HISTORY: Past Surgical History:  Procedure Laterality Date  . ABDOMINAL SURGERY      FAMILY HISTORY: Family History  Problem Relation Age of Onset  . Diabetes Mother     SOCIAL HISTORY: Social History   Socioeconomic History  . Marital status: Single    Spouse name: Not  on file  . Number of children: 0  . Years of education: HS  . Highest education level: Not on file  Occupational History    Employer: OTHER    Comment: N/A  Tobacco Use  . Smoking status: Never Smoker  . Smokeless tobacco: Never Used  Substance and Sexual Activity  . Alcohol use: No    Alcohol/week: 0.0 standard drinks  . Drug use: No  . Sexual activity: Not on file  Other Topics Concern  . Not on file  Social History Narrative   Patient lives at home with his family.   Caffeine Use: none   Social Determinants of Health   Financial Resource Strain:   . Difficulty of Paying Living Expenses: Not on file  Food Insecurity:   . Worried About Programme researcher, broadcasting/film/video in the Last Year: Not on file  . Ran Out of Food in the Last Year: Not on file  Transportation Needs:   . Lack of Transportation (Medical): Not on file  . Lack of Transportation (Non-Medical): Not on file  Physical Activity:   .  Days of Exercise per Week: Not on file  . Minutes of Exercise per Session: Not on file  Stress:   . Feeling of Stress : Not on file  Social Connections:   . Frequency of Communication with Friends and Family: Not on file  . Frequency of Social Gatherings with Friends and Family: Not on file  . Attends Religious Services: Not on file  . Active Member of Clubs or Organizations: Not on file  . Attends Banker Meetings: Not on file  . Marital Status: Not on file  Intimate Partner Violence:   . Fear of Current or Ex-Partner: Not on file  . Emotionally Abused: Not on file  . Physically Abused: Not on file  . Sexually Abused: Not on file      PHYSICAL EXAM  Vitals:   01/21/20 1512  BP: (!) 160/100  Pulse: 92  Weight: 168 lb (76.2 kg)  Height: 5\' 1"  (1.549 m)   Body mass index is 31.74 kg/m.  Generalized: Well developed, in no acute distress  Cardiology: normal rate and rhythm, no murmur noted Respiratory: clear to auscultation bilaterally  Neurological examination    Mentation: Alert, not oriented to time, place, history taking. MR. Follows intermittent commands speech dysarthric  Cranial nerve II-XII: Pupils were equal round reactive to light. Extraocular movements were full, visual field were full on confrontational test. Facial sensation and strength were normal. Uvula tongue midline. Head turning and shoulder shrug  were normal and symmetric. Motor: The motor testing reveals 5 over 5 strength of all 4 extremities. Good symmetric motor tone is noted throughout.  Sensory: Sensory testing is intact to soft touch on all 4 extremities. No evidence of extinction is noted.  Coordination: unable to assess as he does not follow instructions   Gait and station: Gait is wide but stable, no assistive device  DIAGNOSTIC DATA (LABS, IMAGING, TESTING) - I reviewed patient records, labs, notes, testing and imaging myself where available.  No flowsheet data found.   Lab Results  Component Value Date   WBC 6.3 11/09/2019   HGB 13.4 11/09/2019   HCT 43.5 11/09/2019   MCV 102.4 (H) 11/09/2019   PLT 362 11/09/2019      Component Value Date/Time   NA 142 11/09/2019 0547   K 4.5 11/09/2019 0547   CL 109 11/09/2019 0547   CO2 24 11/09/2019 0547   GLUCOSE 86 11/09/2019 0547   BUN 38 (H) 11/09/2019 0547   CREATININE 2.19 (H) 11/09/2019 0547   CALCIUM 9.1 11/09/2019 0547   PROT 6.9 11/09/2019 0547   ALBUMIN 2.8 (L) 11/09/2019 0547   AST 21 11/09/2019 0547   ALT 50 (H) 11/09/2019 0547   ALKPHOS 92 11/09/2019 0547   BILITOT 0.5 11/09/2019 0547   GFRNONAA 39 (L) 11/09/2019 0547   GFRAA 45 (L) 11/09/2019 0547   Lab Results  Component Value Date   TRIG 411 (H) 10/28/2019   Lab Results  Component Value Date   HGBA1C 6.6 (H) 10/30/2019   No results found for: 14/09/2019 Lab Results  Component Value Date   TSH 5.463 (H) 10/29/2019       ASSESSMENT AND PLAN 34 y.o. year old male  has a past medical history of Down syndrome, H/O unilateral nephrectomy,  and Seizures (HCC) (12/2014). here with     ICD-10-CM   1. Partial symptomatic epilepsy with complex partial seizures, not intractable, without status epilepticus (HCC)  G40.209 divalproex (DEPAKOTE SPRINKLE) 125 MG capsule    levETIRAcetam (KEPPRA)  100 MG/ML solution    Valproic Acid Level    Hepatic Function Panel    Overall, Keelon is doing fairly well.  Mom reports that he is recovering nicely from a 12-day hospitalization with Covid pneumonia.  He is following closely with primary care for monitoring of kidney and liver functioning.  Recent labs with primary care reviewed for today's visit and will be uploaded to chart.  Creatinine now 1.75, previously 2.19 on 11/09/2019.  Mom does have concerns of increased seizure activity.  He has episodes multiple times a week at minimum where he becomes unresponsive, some mild jerking and seems irritated or confused following.  I will increase divalproex sprinkle to 625 mg twice daily.  We will continue levetiracetam 1500 mg twice daily.  I will update valproic acid levels and liver function today.  Mom will monitor symptoms closely and call for any worsening.  We will follow-up in 6 months, sooner if needed.  Mom verbalizes understanding and agreement with this plan.   Orders Placed This Encounter  Procedures  . Valproic Acid Level  . Hepatic Function Panel     Meds ordered this encounter  Medications  . divalproex (DEPAKOTE SPRINKLE) 125 MG capsule    Sig: Take 5 capsules (625 mg total) by mouth 2 (two) times daily.    Dispense:  900 capsule    Refill:  3    Dispense sprinkles formulation; to mix with applesauce    Order Specific Question:   Supervising Provider    Answer:   Melvenia Beam [id number]  . levETIRAcetam (KEPPRA) 100 MG/ML solution    Sig: TAKE 15 MLS BY MOUTH 2 (TWO) TIMES DAILY.    Dispense:  2838 mL    Refill:  0    Order Specific Question:   Supervising Provider    Answer:   Melvenia Beam [id number]      I spent 45  minutes with the patient. 50% of this time was spent counseling and educating patient on plan of care and medications.    Debbora Presto, FNP-C 01/21/2020, 4:03 PM [facility] [address] [place], [place] [address] [phone]

## 2020-01-27 ENCOUNTER — Telehealth: Payer: Self-pay | Admitting: *Deleted

## 2020-01-27 NOTE — Telephone Encounter (Signed)
Received labs results from pcp. On desk.

## 2020-02-08 ENCOUNTER — Telehealth: Payer: Self-pay | Admitting: Family Medicine

## 2020-02-08 NOTE — Telephone Encounter (Signed)
Called and LMVM for pts mother that returned call.

## 2020-02-08 NOTE — Telephone Encounter (Signed)
Pt mother called to discuss the pts seizures and medications

## 2020-02-09 MED ORDER — OXCARBAZEPINE 300 MG/5ML PO SUSP: 150.0000 mg | Freq: Two times a day (BID) | ORAL | 12 refills | Status: DC

## 2020-02-09 NOTE — Telephone Encounter (Signed)
ckecking with LaKeisha in [facility] to see (as shows collected by her on the date 3//4/21.

## 2020-02-09 NOTE — Telephone Encounter (Signed)
I called mother of pt.  He did not have drawn as would not cooperate.  She did not bring him back in.  I relayed that we need to get these levels, depakote and keppra.  She recommended starting the oxcarbazepine  150mg  po bid.  If not tomorrow then Monday when she is off.  I will make appt for May when call for lab results.

## 2020-02-09 NOTE — Telephone Encounter (Signed)
I called pts mother, songa.  Pt continues to have issues with sz daily as noted in Amy's note 01/2020.  1-2 daily even with the change of dose of depakote sprinkles.  Still with agitation.  klonopin knocks him out, using depakote for this as well? Plus the seizures?  Please advise.

## 2020-02-09 NOTE — Telephone Encounter (Signed)
Did she ever get his Depakote levels checked? We had trouble getting in the office on appt date and I was under the impression they would bring him back. I would like to check valproic acid and levetiracetam levels. We can add oxcarbamazepine 150mg  twice daily to see if this helps. We will plan to increase to 300mg  twice daily if well tolerated. Please get him back in for follow up in May. TY.

## 2020-02-10 NOTE — Progress Notes (Signed)
I reviewed note and agree with plan.   Andrae Claunch R. Keyante Durio, MD 02/10/2020, 8:37 AM Certified in Neurology, Neurophysiology and Neuroimaging  [facility] [address], [address] , [place] [address] [phone]  

## 2020-03-01 MED ORDER — LEVETIRACETAM 100 MG/ML PO SOLN
1500.00 | ORAL | Status: DC
Start: 2020-03-01 — End: 2020-03-01

## 2020-03-01 MED ORDER — HYDROXYZINE HCL 25 MG PO TABS
25.00 | ORAL_TABLET | ORAL | Status: DC
Start: ? — End: 2020-03-01

## 2020-03-01 MED ORDER — GENERIC EXTERNAL MEDICATION
2.00 | Status: DC
Start: ? — End: 2020-03-01

## 2020-03-01 MED ORDER — HYDRALAZINE HCL 20 MG/ML IJ SOLN
5.00 | INTRAMUSCULAR | Status: DC
Start: ? — End: 2020-03-01

## 2020-03-01 MED ORDER — PREDNISONE 10 MG PO TABS
10.00 | ORAL_TABLET | ORAL | Status: DC
Start: 2020-03-02 — End: 2020-03-01

## 2020-03-01 MED ORDER — DIVALPROEX SODIUM 125 MG PO CSDR
625.00 | DELAYED_RELEASE_CAPSULE | ORAL | Status: DC
Start: 2020-03-01 — End: 2020-03-01

## 2020-03-01 MED ORDER — HEPARIN SODIUM (PORCINE) 5000 UNIT/ML IJ SOLN
5000.00 | INTRAMUSCULAR | Status: DC
Start: 2020-03-01 — End: 2020-03-01

## 2020-03-01 MED ORDER — NYSTATIN 100000 UNIT/GM EX POWD
CUTANEOUS | Status: DC
Start: 2020-03-01 — End: 2020-03-01

## 2020-03-01 MED ORDER — POLYETHYLENE GLYCOL 3350 17 GM/SCOOP PO POWD
17.00 | ORAL | Status: DC
Start: 2020-03-02 — End: 2020-03-01

## 2020-03-01 MED ORDER — ACETAMINOPHEN 325 MG PO TABS
650.00 | ORAL_TABLET | ORAL | Status: DC
Start: ? — End: 2020-03-01

## 2020-03-01 MED ORDER — OXCARBAZEPINE 300 MG/5ML PO SUSP
300.00 | ORAL | Status: DC
Start: 2020-03-01 — End: 2020-03-01

## 2020-03-09 NOTE — Telephone Encounter (Signed)
I called and spoke to mother and attempted to make appt 04-12-20 at 1130.  Pt is doing well.  I wanted to question if had labs done.  I see that he did not from [facility]. She is to call back and confirm appt.

## 2020-03-14 ENCOUNTER — Other Ambulatory Visit: Payer: Self-pay | Admitting: Family Medicine

## 2020-03-14 MED ORDER — OXCARBAZEPINE 300 MG/5ML PO SUSP: 300.0000 mg | Freq: Two times a day (BID) | ORAL | 11 refills | Status: DC

## 2020-03-14 NOTE — Telephone Encounter (Signed)
appt made 04-12-20 at 1130. See 02-20-20 note in Epic fpr labs done.  (no sz levels seen).

## 2020-03-14 NOTE — Telephone Encounter (Signed)
Pts mother requesting a refill for OXcarbazepine (TRILEPTAL) 300 MG/5ML suspension pharmacy states the medication isnt due til 10 more days but the pt is out now Please advise

## 2020-03-14 NOTE — Telephone Encounter (Signed)
I called mother of pt.  He was in [facility] 02-20-20 and had labs, she is to call and see if VPA and hepatic function panel was done and fax to me at [phone].  She has been giving trileptal 300mg  po bid (71ml po bid) since 02-09-20.  She states it was her fault and what to do?  I will converse with AL/NP and let her know.  New porescription will be needed.

## 2020-03-14 NOTE — Addendum Note (Signed)
Addended by: Shawnie Dapper L on: 03/14/2020 04:12 PM   Modules accepted: Orders

## 2020-04-12 ENCOUNTER — Ambulatory Visit: Payer: Self-pay | Admitting: Family Medicine

## 2020-07-13 ENCOUNTER — Ambulatory Visit: Payer: Medicaid Other | Admitting: Family Medicine

## 2020-07-13 ENCOUNTER — Encounter: Payer: Self-pay | Admitting: Family Medicine

## 2020-07-13 ENCOUNTER — Other Ambulatory Visit: Payer: Self-pay

## 2020-07-13 VITALS — BP 138/95 | HR 109 | Ht 60.0 in | Wt 176.0 lb

## 2020-07-13 DIAGNOSIS — G40209 Localization-related (focal) (partial) symptomatic epilepsy and epileptic syndromes with complex partial seizures, not intractable, without status epilepticus: Secondary | ICD-10-CM

## 2020-07-13 MED ORDER — OXCARBAZEPINE 300 MG/5ML PO SUSP: 300.0000 mg | Freq: Two times a day (BID) | ORAL | 3 refills | Status: DC

## 2020-07-13 MED ORDER — DIVALPROEX SODIUM 125 MG PO CSDR: 625.0000 mg | DELAYED_RELEASE_CAPSULE | Freq: Two times a day (BID) | ORAL | 3 refills | Status: DC

## 2020-07-13 MED ORDER — LEVETIRACETAM 100 MG/ML PO SOLN: ORAL | 3 refills | Status: DC

## 2020-07-13 NOTE — Progress Notes (Addendum)
PATIENT: Isaac Murphy DOB: 1986/07/07  REASON FOR VISIT: follow up HISTORY FROM: patient  Chief Complaint  Patient presents with  . Follow-up    rm 1  . Seizures    Pt here for a f/u on seizures. Pts mom said there are no new sx.     HISTORY OF PRESENT ILLNESS: Today 07/13/20 Isaac Murphy is a 34 y.o. male here today for follow up for seizures.  His mom presents with him today and aids in history.  She reports that he is doing very well.  He has continue divalproex 650 mg twice daily, levetiracetam 1500 mg twice daily as well as ox carbamazepine 300 mg twice daily.  She reports that blackout spells and spells of jerking have completely stopped.  He was hospitalized in April for concerns of congestive heart failure and pneumonia.  He had follow-up with pulmonology, however, would not get out of the car for his appointment.  Appointment was transitioned to a virtual appointment.  He has not seen his primary care since being discharged from the hospital in April.  Mom reports that she has a blood pressure cuff at home.  She is unsure what typical blood pressures are.  She reports that he is feeling well.  HISTORY: (copied from my note on 01/21/2020)  Isaac Murphy is a 34 y.o. male here today for follow up for seizures. We increased divalproex to 500mg  twice daily last year. He was to continue levetiracetam 1500mg  BID and follow up in 6 months. Mom feels that over the past year, he has had more events where he will "black out" for a couple of seconds. He is unable to communicate. She can see "jerking" for a few seconds then it stops. Sometimes he falls over or will drop an object with event. Mom reports that he has an episode almost daily. He seems irritable following event. He was admitted to [facility] in 10/2019 for COVID pneumonia. He has been followed by PCP closely following hospitalization and mom reports he is doing well.    HISTORY: (copied from my note on  01/15/2019)  11/2019 [name] a 34 y.o.malehere today for follow up for seizures. He has hadseveral seizures since last being seen. He had a convulsive seizure in September and November 2019. Another on2/22/20.She reported he's havingspells where"hegoesintoadaze anddropsthings."He has had similar events in the past but she has always been able to talk him through it. She has noticed this is happening about 1-2 times a day. Event lasts just a couple of seconds.He has not been sick or missed any doses of medications.He is currently taking levetiracetam 1500mg  BID and divalproex 375mg  BID.Mom reports that he recently had labs drawn with PCP but is uncertain of results.   HISTORY: (copied fromDr Penumalli'snote on 01/13/2018) UPDATE (01/13/18, VRP): Since last visit, doingwell. Toleratinghigher dose of depakote. No alleviating or aggravating factors.Some increased weight. No seizures since Oct 2018.  UPDATE (09/11/17, MM):34 year old male with a history of seizure events. He returns today for an evaluation. His mom reports that he had a seizure on Saturday. Reports that it was a generalized seizure. He had convulsing in the extremities with loss of consciousness. She states that histongue was also purple. The seizure lasted approximately 3 minutes. She is unsure if he lostbowels orbladder as he wears depends regularly. She denies missing any doses. She does state that the power was outThursday and Friday. Thereforethe patient did not sleep much because of this. He returns today  for an evaluation.  UPDATE 02/20/17: Since last visit, had 1 seizure in Feb 2018; mother saw patient him after seizure and patient breathing heavy and seemed more confusion. No triggering factors.   UPDATE 08/07/16: Since last visit, doing well. No more seizures since starting depakote sprinkles.   UPDATE 03/19/16: Since last visit, was doing well until Feb 2017, then had 4 seizures since that  time. No triggering factors. Sz were staring then convulsive.  UPDATE 09/26/15: Since last visit, was doing well until 09/17/15 (breakthrough sz). Tolerating incr LEV 1000mg  BID dose.   UPDATE 06/23/15: Since last visit, was doing well until Feb 2016 (had 2nd seizure). Then went to ER and started on LEV 500mg  BID. Then had addl sz in April, June and July 2016. Some warning, as patient would go to sofa and brace himself, then mouth and face twitching, then generalized convulsions, gasping for breath, then stopped. Confusion for 1-2 hours, then returns to baseline. Tolerating LEV.   PRIOR HPI (11/16/14): 34 year old male with history of Down syndrome, congenital solitary kidney, here for evaluation of seizure. No right or history of seizures until 11/10/14, patient was eating breakfast when all of a sudden he made gurgling sounds, had convulsions, fell out of his chair. Seizure lasted for 5 minutes. Patient did have tongue biting with the seizure. He was confused, dazed, for another 15-20 minutes. Patient was gradually returned to baseline by the time he was loaded into the ambulance and came to the emergency room. No recent sleep changes, infections, stresses, traumas.    REVIEW OF SYSTEMS: Out of a complete 14 system review of symptoms, the patient complains only of the following symptoms, seizures and all other reviewed systems are negative.    ALLERGIES: No Known Allergies  HOME MEDICATIONS: Outpatient Medications Prior to Visit  Medication Sig Dispense Refill  . clonazePAM (KLONOPIN) 0.5 MG tablet Take 0.5 mg by mouth daily as needed.    . cyclobenzaprine (FLEXERIL) 5 MG tablet Take 5 mg by mouth 2 (two) times daily as needed.    . Pseudoephedrine HCl (SUDAFED PO) Take 1 tablet by mouth as needed (sinus).    . triamcinolone cream (KENALOG) 0.1 % Apply 1 application topically 2 (two) times daily as needed.    . Vitamin D, Ergocalciferol, (DRISDOL) 1.25 MG (50000 UT) CAPS capsule Take 50,000  Units by mouth once a week. Sundays    . divalproex (DEPAKOTE SPRINKLE) 125 MG capsule Take 5 capsules (625 mg total) by mouth 2 (two) times daily. 900 capsule 3  . levETIRAcetam (KEPPRA) 100 MG/ML solution TAKE 15 MLS BY MOUTH 2 (TWO) TIMES DAILY. 2838 mL 0  . metoprolol tartrate (LOPRESSOR) 25 MG tablet Take 1 tablet (25 mg total) by mouth 2 (two) times daily. 60 tablet 0  . OXcarbazepine (TRILEPTAL) 300 MG/5ML suspension Take 5 mLs (300 mg total) by mouth 2 (two) times daily. 300 mL 11   No facility-administered medications prior to visit.    PAST MEDICAL HISTORY: Past Medical History:  Diagnosis Date  . Down syndrome   . H/O unilateral nephrectomy   . Seizures (HCC) 12/2014   last sz 03/18/16    PAST SURGICAL HISTORY: Past Surgical History:  Procedure Laterality Date  . ABDOMINAL SURGERY      FAMILY HISTORY: Family History  Problem Relation Age of Onset  . Diabetes Mother     SOCIAL HISTORY: Social History   Socioeconomic History  . Marital status: Single    Spouse name: Not on file  .  Number of children: 0  . Years of education: HS  . Highest education level: Not on file  Occupational History    Employer: OTHER    Comment: N/A  Tobacco Use  . Smoking status: Never Smoker  . Smokeless tobacco: Never Used  Vaping Use  . Vaping Use: Unknown  Substance and Sexual Activity  . Alcohol use: No    Alcohol/week: 0.0 standard drinks  . Drug use: No  . Sexual activity: Not on file  Other Topics Concern  . Not on file  Social History Narrative   Patient lives at home with his family.   Caffeine Use: none   Social Determinants of Health   Financial Resource Strain:   . Difficulty of Paying Living Expenses: Not on file  Food Insecurity:   . Worried About Programme researcher, broadcasting/film/videounning Out of Food in the Last Year: Not on file  . Ran Out of Food in the Last Year: Not on file  Transportation Needs:   . Lack of Transportation (Medical): Not on file  . Lack of Transportation  (Non-Medical): Not on file  Physical Activity:   . Days of Exercise per Week: Not on file  . Minutes of Exercise per Session: Not on file  Stress:   . Feeling of Stress : Not on file  Social Connections:   . Frequency of Communication with Friends and Family: Not on file  . Frequency of Social Gatherings with Friends and Family: Not on file  . Attends Religious Services: Not on file  . Active Member of Clubs or Organizations: Not on file  . Attends BankerClub or Organization Meetings: Not on file  . Marital Status: Not on file  Intimate Partner Violence:   . Fear of Current or Ex-Partner: Not on file  . Emotionally Abused: Not on file  . Physically Abused: Not on file  . Sexually Abused: Not on file      PHYSICAL EXAM  Vitals:   07/13/20 1359  BP: (!) 138/95  Pulse: (!) 109  Weight: 176 lb (79.8 kg)  Height: 5' (1.524 m)   Body mass index is 34.37 kg/m.  Generalized: Well developed, in no acute distress  Cardiology: normal rate and rhythm, no murmur noted Respiratory: clear to auscultation bilaterally  Neurological examination  Mentation: Alert, not oriented to time, place, history taking. Follows intermittent commands speech is dysarthric.  Patient has MR. Cranial nerve II-XII: Pupils were equal round reactive to light.  Unable to completely assess cranial nerves as patient is not following instructions today. Motor: The motor testing reveals 5 over 5 strength of all 4 extremities. Good symmetric motor tone is noted throughout.  Sensory: Sensory testing is intact to soft touch on all 4 extremities. No evidence of extinction is noted.  Coordination: Unable to assess, patient does not follow instructions. Gait and station: Gait is wide-based  DIAGNOSTIC DATA (LABS, IMAGING, TESTING) - I reviewed patient records, labs, notes, testing and imaging myself where available.  No flowsheet data found.   Lab Results  Component Value Date   WBC 6.3 11/09/2019   HGB 13.4 11/09/2019    HCT 43.5 11/09/2019   MCV 102.4 (H) 11/09/2019   PLT 362 11/09/2019      Component Value Date/Time   NA 142 11/09/2019 0547   K 4.5 11/09/2019 0547   CL 109 11/09/2019 0547   CO2 24 11/09/2019 0547   GLUCOSE 86 11/09/2019 0547   BUN 38 (H) 11/09/2019 0547   CREATININE 2.19 (H) 11/09/2019 16100547  CALCIUM 9.1 11/09/2019 0547   PROT 6.9 11/09/2019 0547   ALBUMIN 2.8 (L) 11/09/2019 0547   AST 21 11/09/2019 0547   ALT 50 (H) 11/09/2019 0547   ALKPHOS 92 11/09/2019 0547   BILITOT 0.5 11/09/2019 0547   GFRNONAA 39 (L) 11/09/2019 0547   GFRAA 45 (L) 11/09/2019 0547   Lab Results  Component Value Date   TRIG 411 (H) 10/28/2019   Lab Results  Component Value Date   HGBA1C 6.6 (H) 10/30/2019   No results found for: OEUMPNTI14 Lab Results  Component Value Date   TSH 5.463 (H) 10/29/2019       ASSESSMENT AND PLAN 34 y.o. year old male  has a past medical history of Down syndrome, H/O unilateral nephrectomy, and Seizures (HCC) (12/2014). here with     ICD-10-CM   1. Partial symptomatic epilepsy with complex partial seizures, not intractable, without status epilepticus (HCC)  G40.209     Songa, patient's mother, reports that he is doing well at home.  He has tolerated increased dose of divalproex as well as the addition of oxcarbazepine.  We will continue current treatment plan.  I will update labs today.  Mom was encouraged to continue close follow-up with pulmonary as directed as well as reschedule follow-up with primary care.  I have asked her to monitor blood pressures at home.  Seizure precautions reviewed.  He will follow-up with me in 1 year, sooner if needed.  She verbalizes understanding and agreement with this plan.   No orders of the defined types were placed in this encounter.    No orders of the defined types were placed in this encounter.     I spent 15 minutes with the patient. 50% of this time was spent counseling and educating patient on plan of care and  medications.    Shawnie Dapper, FNP-C 07/13/2020, 2:06 PM [facility] [address], [address] [place], [place] [address] [phone]

## 2020-07-13 NOTE — Patient Instructions (Addendum)
We will update labs today. Continue levetiracetam, divalproex, and oxcarbamazepine.   Follow up with PCP asap for blood pressure review.   Follow up with me in 1 year, sooner if needed   Seizure, Adult A seizure is a sudden burst of abnormal electrical activity in the brain. Seizures usually last from 30 seconds to 2 minutes. They can cause many different symptoms. Usually, seizures are not harmful unless they last a long time. What are the causes? Common causes of this condition include:  Fever or infection.  Conditions that affect the brain, such as: ? A brain abnormality that you were born with. ? A brain or head injury. ? Bleeding in the brain. ? A tumor. ? Stroke. ? Brain disorders such as autism or cerebral palsy.  Low blood sugar.  Conditions that are passed from parent to child (are inherited).  Problems with substances, such as: ? Having a reaction to a drug or a medicine. ? Suddenly stopping the use of a substance (withdrawal). In some cases, the cause may not be known. A person who has repeated seizures over time without a clear cause has a condition called epilepsy. What increases the risk? You are more likely to get this condition if you have:  A family history of epilepsy.  Had a seizure in the past.  A brain disorder.  A history of head injury, lack of oxygen at birth, or strokes. What are the signs or symptoms? There are many types of seizures. The symptoms vary depending on the type of seizure you have. Examples of symptoms during a seizure include:  Shaking (convulsions).  Stiffness in the body.  Passing out (losing consciousness).  Head nodding.  Staring.  Not responding to sound or touch.  Loss of bladder control and bowel control. Some people have symptoms right before and right after a seizure happens. Symptoms before a seizure may include:  Fear.  Worry (anxiety).  Feeling like you may vomit (nauseous).  Feeling like the room  is spinning (vertigo).  Feeling like you saw or heard something before (dj vu).  Odd tastes or smells.  Changes in how you see. You may see flashing lights or spots. Symptoms after a seizure happens can include:  Confusion.  Sleepiness.  Headache.  Weakness on one side of the body. How is this treated? Most seizures will stop on their own in under 5 minutes. In these cases, no treatment is needed. Seizures that last longer than 5 minutes will usually need treatment. Treatment can include:  Medicines given through an IV tube.  Avoiding things that are known to cause your seizures. These can include medicines that you take for another condition.  Medicines to treat epilepsy.  Surgery to stop the seizures. This may be needed if medicines do not help. Follow these instructions at home: Medicines  Take over-the-counter and prescription medicines only as told by your doctor.  Do not eat or drink anything that may keep your medicine from working, such as alcohol. Activity  Do not do any activities that would be dangerous if you had another seizure, like driving or swimming. Wait until your doctor says it is safe for you to do them.  If you live in the [place] ask your local DMV (department of motor vehicles) when you can drive.  Get plenty of rest. Teaching others Teach friends and family what to do when you have a seizure. They should:  Lay you on the ground.  Protect your head and body.  Loosen  any tight clothing around your neck.  Turn you on your side.  Not hold you down.  Not put anything into your mouth.  Know whether or not you need emergency care.  Stay with you until you are better.  General instructions  Contact your doctor each time you have a seizure.  Avoid anything that gives you seizures.  Keep a seizure diary. Write down: ? What you think caused each seizure. ? What you remember about each seizure.  Keep all follow-up visits as told by your  doctor. This is important. Contact a doctor if:  You have another seizure.  You have seizures more often.  There is any change in what happens during your seizures.  You keep having seizures with treatment.  You have symptoms of being sick or having an infection. Get help right away if:  You have a seizure that: ? Lasts longer than 5 minutes. ? Is different than seizures you had before. ? Makes it harder to breathe. ? Happens after you hurt your head.  You have any of these symptoms after a seizure: ? Not being able to speak. ? Not being able to use a part of your body. ? Confusion. ? A bad headache.  You have two or more seizures in a row.  You do not wake up right after a seizure.  You get hurt during a seizure. These symptoms may be an emergency. Do not wait to see if the symptoms will go away. Get medical help right away. Call your local emergency services (911 in the [place] Do not drive yourself to the hospital. Summary  Seizures usually last from 30 seconds to 2 minutes. Usually, they are not harmful unless they last a long time.  Do not eat or drink anything that may keep your medicine from working, such as alcohol.  Teach friends and family what to do when you have a seizure.  Contact your doctor each time you have a seizure. This information is not intended to replace advice given to you by your health care provider. Make sure you discuss any questions you have with your health care provider. Document Revised: 01/23/2019 Document Reviewed: 01/23/2019 Elsevier Patient Education  2020 [employer].

## 2020-07-13 NOTE — Addendum Note (Signed)
Addended byNicholas Lose, Baker Kogler L on: 07/13/2020 03:05 PM   Modules accepted: Orders

## 2020-07-14 LAB — COMPREHENSIVE METABOLIC PANEL
ALT: 12 IU/L (ref 0–44)
AST: 19 IU/L (ref 0–40)
Albumin/Globulin Ratio: 1.1 — ABNORMAL LOW (ref 1.2–2.2)
Albumin: 4 g/dL (ref 4.0–5.0)
Alkaline Phosphatase: 126 IU/L — ABNORMAL HIGH (ref 48–121)
BUN/Creatinine Ratio: 7 — ABNORMAL LOW (ref 9–20)
BUN: 15 mg/dL (ref 6–20)
Bilirubin Total: <0.2 mg/dL (ref 0.0–1.2)
CO2: 24 mmol/L (ref 20–29)
Calcium: 9.2 mg/dL (ref 8.7–10.2)
Chloride: 103 mmol/L (ref 96–106)
Creatinine, Ser: 2.22 mg/dL — ABNORMAL HIGH (ref 0.76–1.27)
GFR calc Af Amer: 44 mL/min/{1.73_m2} — ABNORMAL LOW (ref >59)
GFR calc non Af Amer: 38 mL/min/{1.73_m2} — ABNORMAL LOW (ref >59)
Globulin, Total: 3.6 g/dL (ref 1.5–4.5)
Glucose: 96 mg/dL (ref 65–99)
Potassium: 4.5 mmol/L (ref 3.5–5.2)
Sodium: 142 mmol/L (ref 134–144)
Total Protein: 7.6 g/dL (ref 6.0–8.5)

## 2020-07-14 LAB — CBC WITH DIFFERENTIAL/PLATELET
Basophils Absolute: 0 10*3/uL (ref 0.0–0.2)
Basos: 1 %
EOS (ABSOLUTE): 0.1 10*3/uL (ref 0.0–0.4)
Eos: 2 %
Hematocrit: 36.7 % — ABNORMAL LOW (ref 37.5–51.0)
Hemoglobin: 11.7 g/dL — ABNORMAL LOW (ref 13.0–17.7)
Immature Grans (Abs): 0 10*3/uL (ref 0.0–0.1)
Immature Granulocytes: 1 %
Lymphocytes Absolute: 1.4 10*3/uL (ref 0.7–3.1)
Lymphs: 29 %
MCH: 30.5 pg (ref 26.6–33.0)
MCHC: 31.9 g/dL (ref 31.5–35.7)
MCV: 96 fL (ref 79–97)
Monocytes Absolute: 0.9 10*3/uL (ref 0.1–0.9)
Monocytes: 19 %
Neutrophils Absolute: 2.5 10*3/uL (ref 1.4–7.0)
Neutrophils: 48 %
Platelets: 490 10*3/uL — ABNORMAL HIGH (ref 150–450)
RBC: 3.83 x10E6/uL — ABNORMAL LOW (ref 4.14–5.80)
RDW: 15 % (ref 11.6–15.4)
WBC: 4.9 10*3/uL (ref 3.4–10.8)

## 2020-07-14 LAB — VALPROIC ACID LEVEL: Valproic Acid Lvl: 70 ug/mL (ref 50–100)

## 2020-07-14 LAB — CARBAMAZEPINE LEVEL, TOTAL: Carbamazepine (Tegretol), S: <2 ug/mL — ABNORMAL LOW (ref 4.0–12.0)

## 2020-07-14 NOTE — Progress Notes (Signed)
I reviewed note and agree with plan.   Giavanna Kang R. Anguel Delapena, MD 07/14/2020, 6:06 PM Certified in Neurology, Neurophysiology and Neuroimaging  [facility] [address], [address] [place], [place] [address] [phone]  

## 2020-07-19 ENCOUNTER — Telehealth: Payer: Self-pay | Admitting: *Deleted

## 2020-07-19 NOTE — Telephone Encounter (Signed)
Spoke to mother and relayed the lab results to her.  Will fax to pcp resullts as well.  Pt had not seen him since in hospital.  He has not had anymore episodes.  No change in his current plan.  She verbalized understanding.  Sent to pcp in epic.

## 2020-07-19 NOTE — Telephone Encounter (Signed)
-----   Message from Shawnie Dapper, NP sent at 07/18/2020  4:38 PM EDT ----- Please make sure mom knows labs are back. His hemoglobin is a little low. This could be from illness in the spring but I would like PCP to monitor closely. Kidney function is fairly stable from previous readings. Depakote levels look good. Carbamazepine levels are a little low but I know she mentioned that he was out of his medication so that is probably why. We will continue current treatment plan. Have her call with any new concerns of return of seizure activity.

## 2020-07-19 NOTE — Telephone Encounter (Signed)
Called and LMVM for mother of pt to return call for lab results. pcp Dr. Concepcion Elk.

## 2020-07-28 ENCOUNTER — Ambulatory Visit: Payer: Medicaid Other | Admitting: Family Medicine

## 2020-09-14 IMAGING — US US RENAL
1 series · 14 of 25 positions shown · non-contrast
Comparison: CT dated May 29, 2018.

CLINICAL DATA: Acute renal failure

EXAM:
RENAL / URINARY TRACT ULTRASOUND COMPLETE

[Series 1: us renal · 25 acquisitions, 14 frames shown]
[im 1/25]
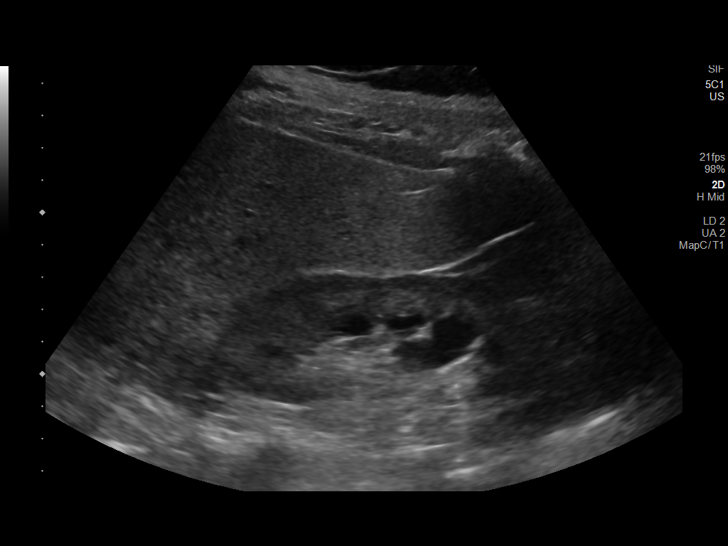
[im 3/25]
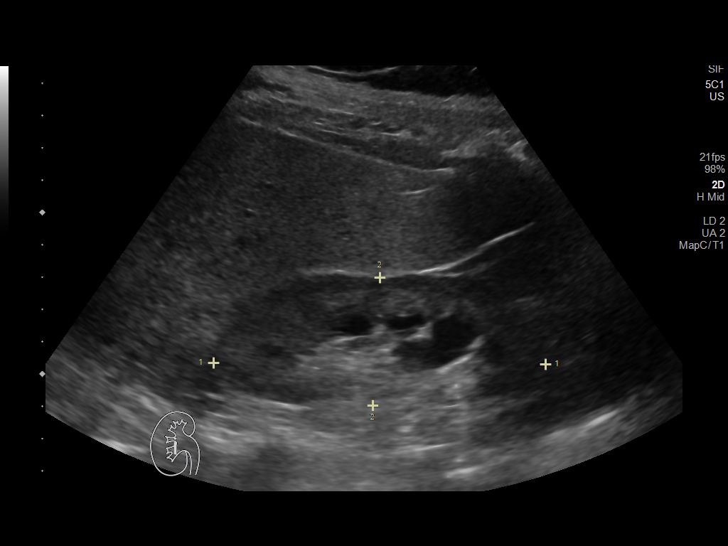
[im 5/25]
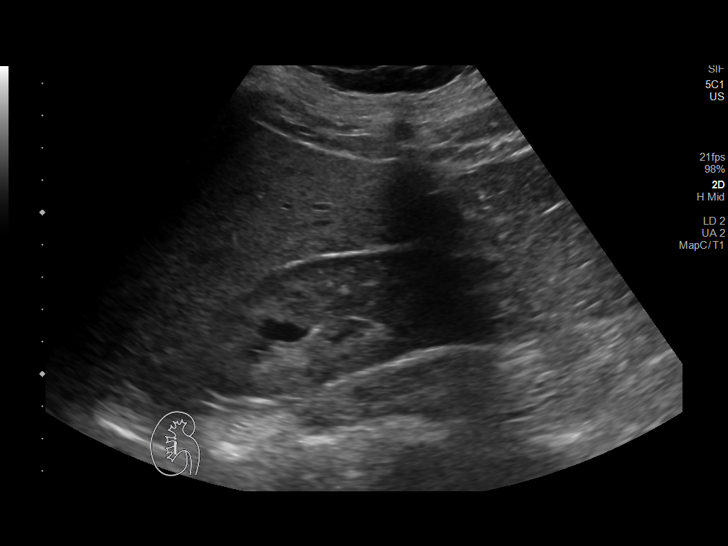
[im 7/25]
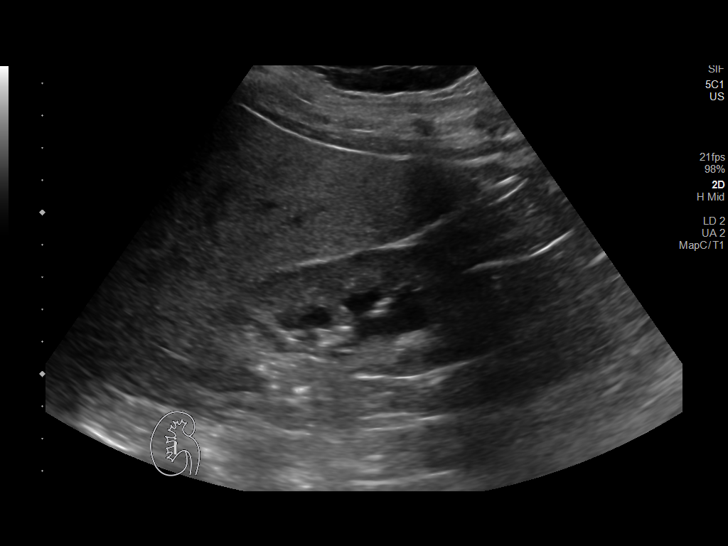
[im 9/25]
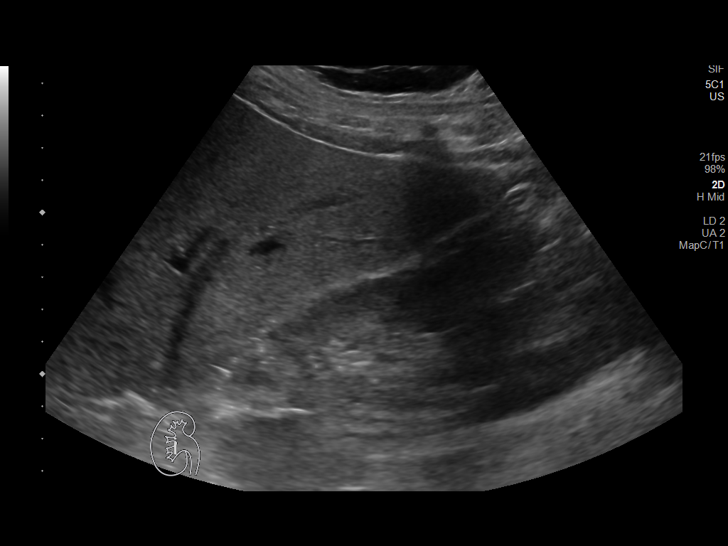
[im 10/25]
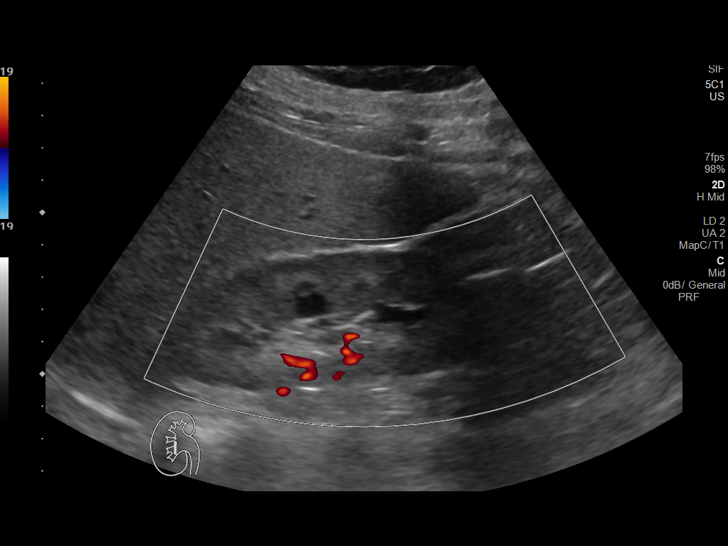
[im 12/25]
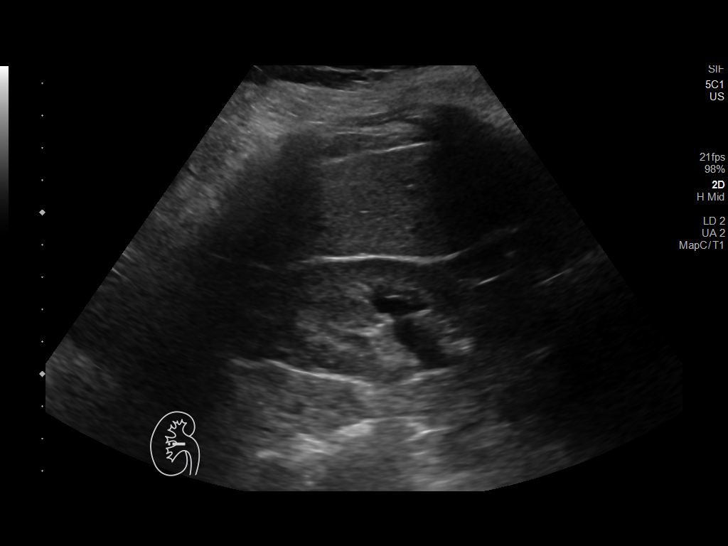
[im 14/25]
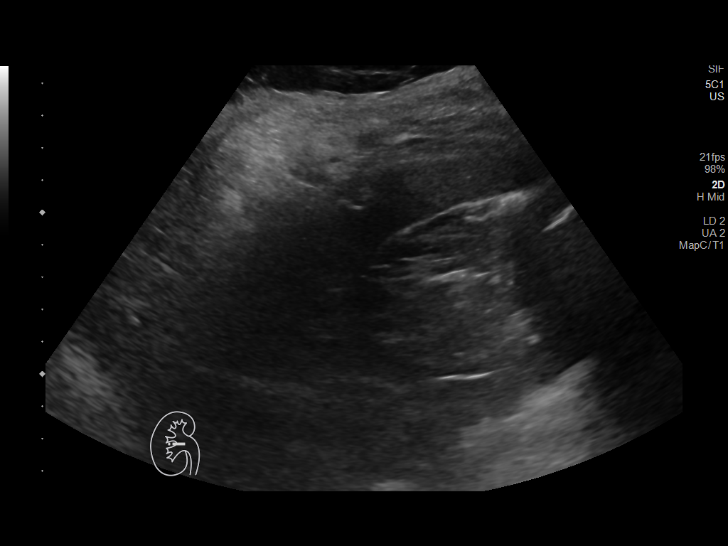
[im 16/25]
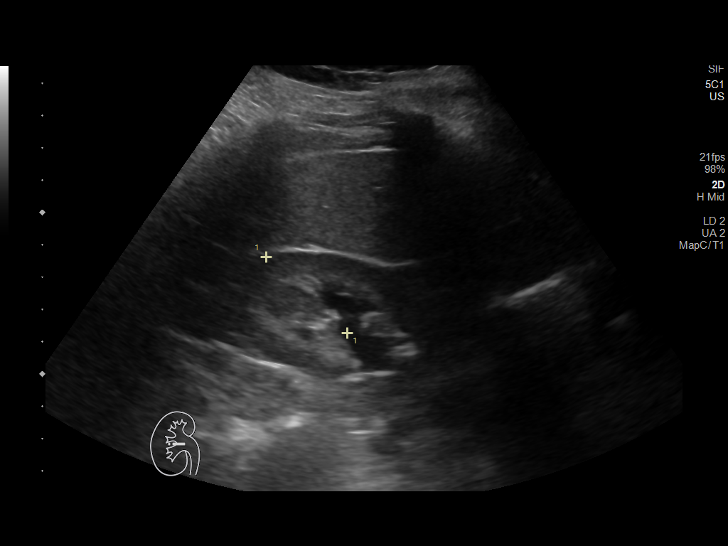
[im 17/25]
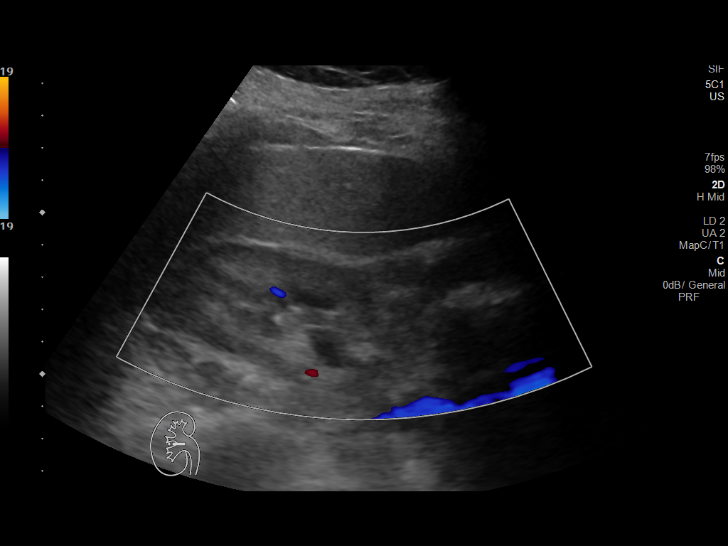
[im 19/25]
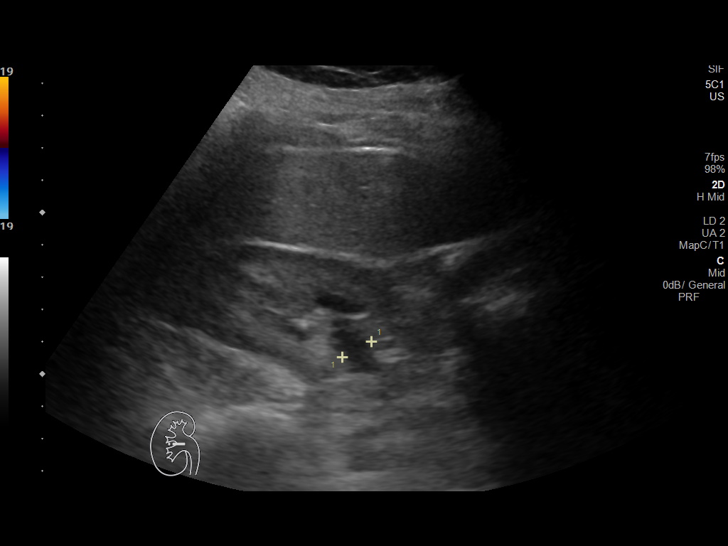
[im 21/25]
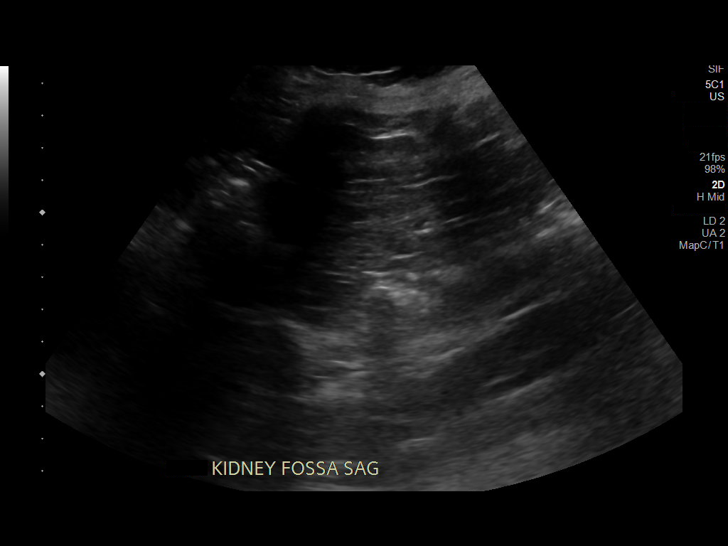
[im 23/25]
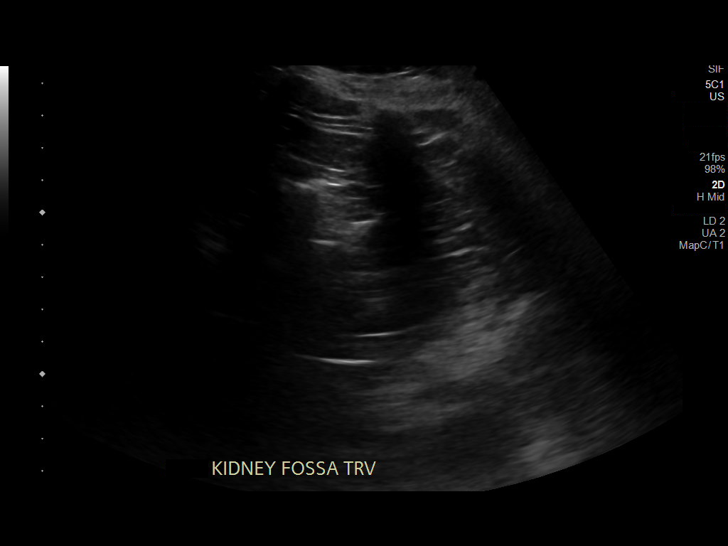
[im 25/25]
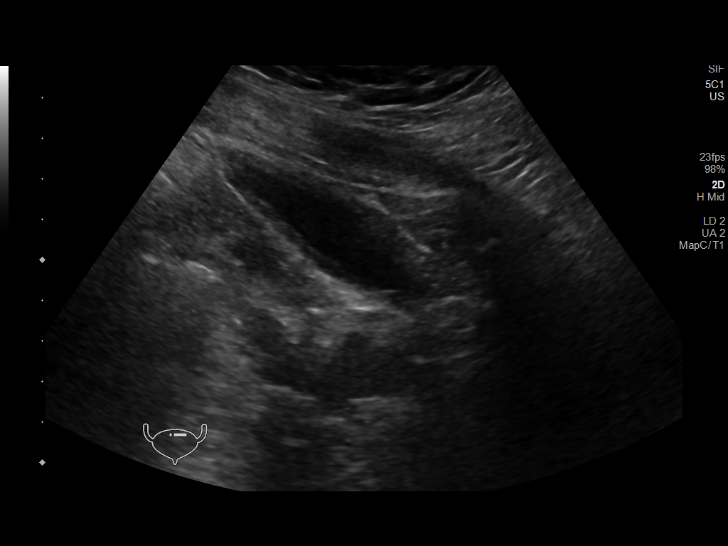

[14 of 25 positions shown; findings below may reference images not displayed]

FINDINGS: Right Kidney:

Renal measurements: 10.3 by 4 x 3.4 cm = volume: 73 mL. There is
increased cortical echogenicity. There is mild hydronephrosis.

Left Kidney:

The left kidney is not visualized, consistent with the patient's
reported history of a solitary right kidney.

Bladder:

Appears normal for degree of bladder distention.

Other:

None.
IMPRESSION: 1. Mild right-sided hydronephrosis.
2. Echogenic right kidney which can be seen in patients with medical
renal disease.
3. Nonvisualization of the left kidney.

## 2020-09-16 IMAGING — DX DG CHEST 1V PORT
1 series · 1 of 1 positions shown · non-contrast
Comparison: 10/28/2019.

CLINICAL DATA: Hypoxia.

EXAM:
PORTABLE CHEST 1 VIEW

[chest]
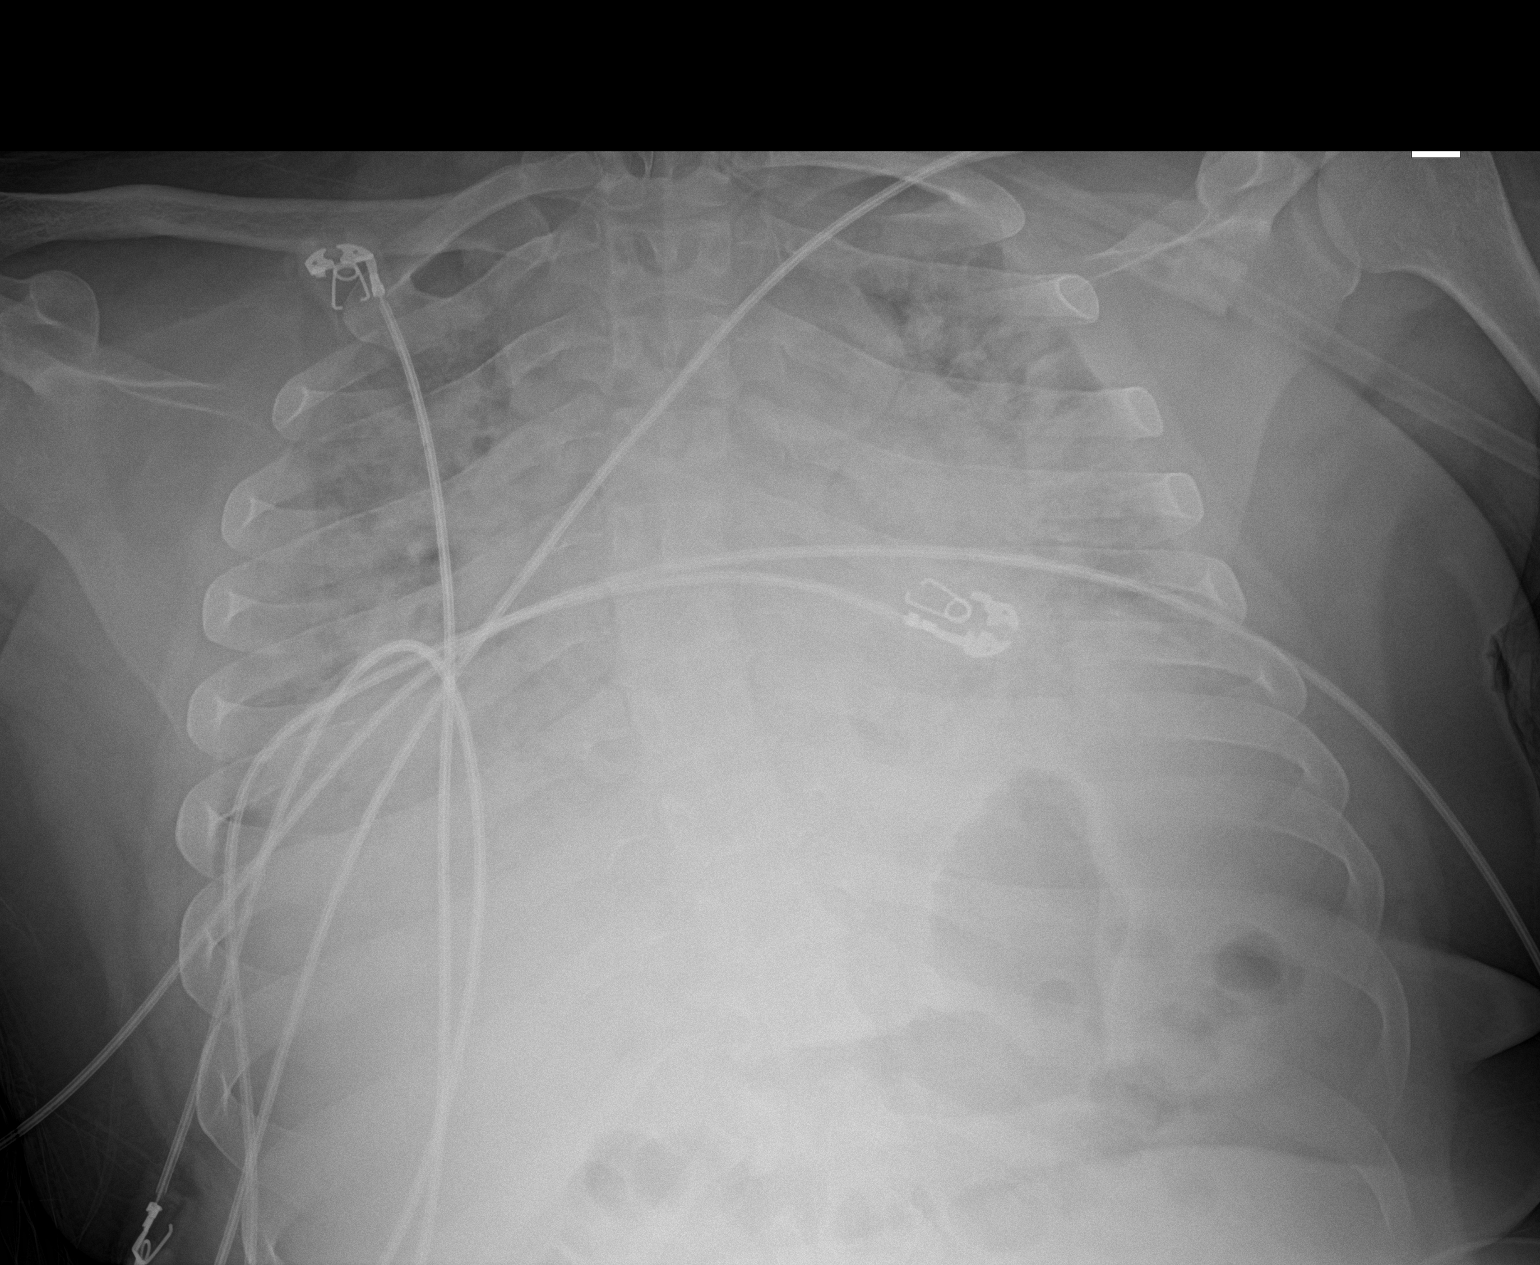

[1 of 1 positions shown; findings below may reference images not displayed]

FINDINGS: There are extensive bilateral airspace lung opacities, which appear
more confluent in the bases than on the prior exam, now mostly
obscuring the hemidiaphragms.

No pneumothorax.
IMPRESSION: 1. Worsened lung aeration when compared to the prior study with an
interval increase in extensive bilateral airspace lung opacities,
most evident at the bases. No other change.

## 2020-09-23 IMAGING — US US RENAL
1 series · 14 of 22 positions shown · non-contrast
Comparison: [DATE] [DATE], [DATE].  [DATE] [DATE], [DATE].

CLINICAL DATA: Acute kidney injury.

EXAM:
RENAL / URINARY TRACT ULTRASOUND COMPLETE

[Series 1: us renal · 0.26mm/px · 14 of 22 slices shown]
[im 1/22]
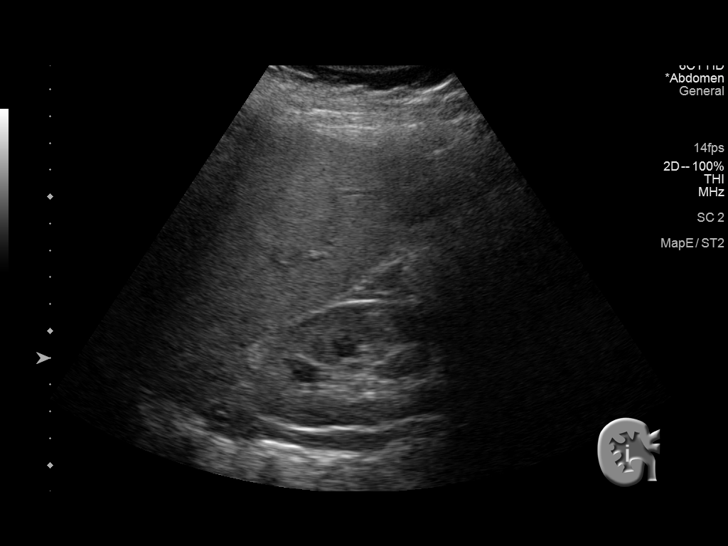
[im 3/22]
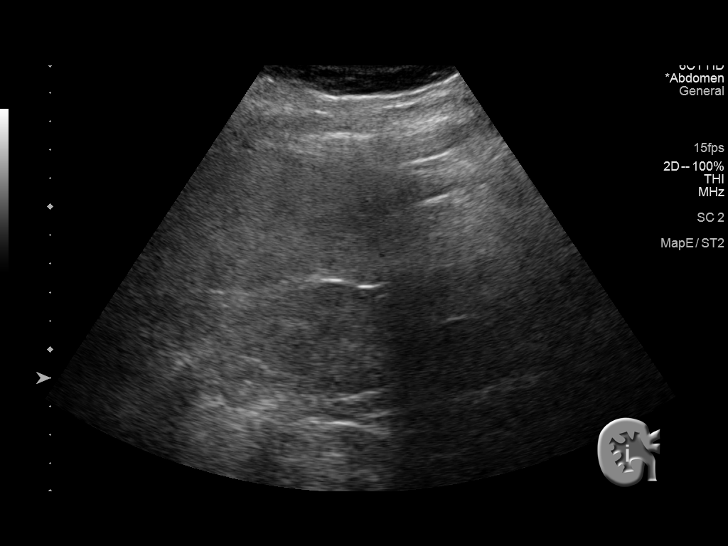
[im 4/22]
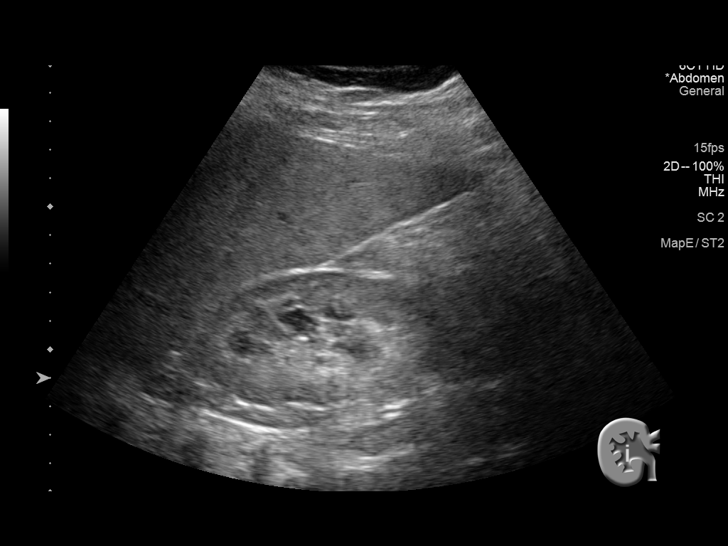
[im 6/22]
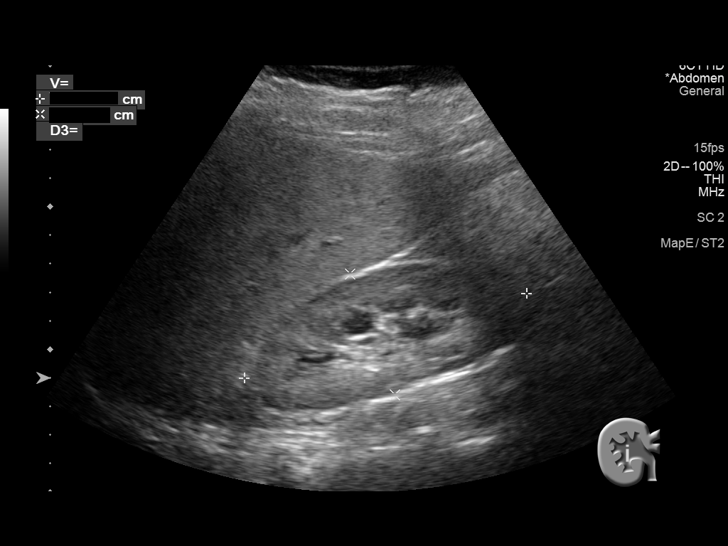
[im 8/22]
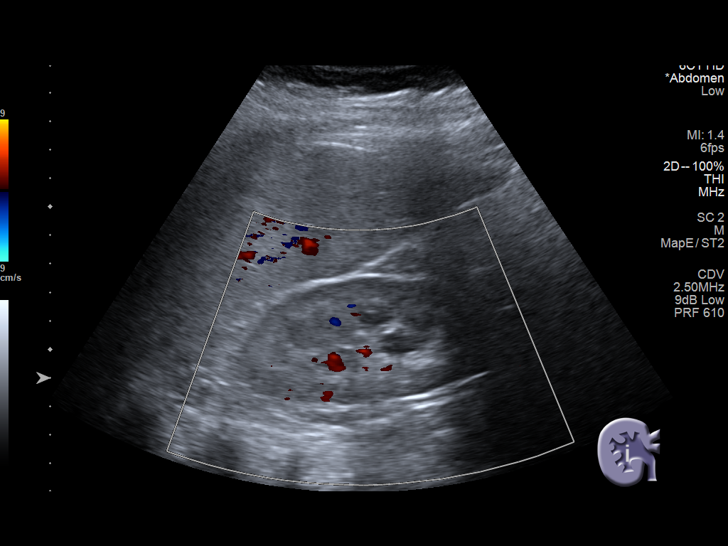
[im 9/22]
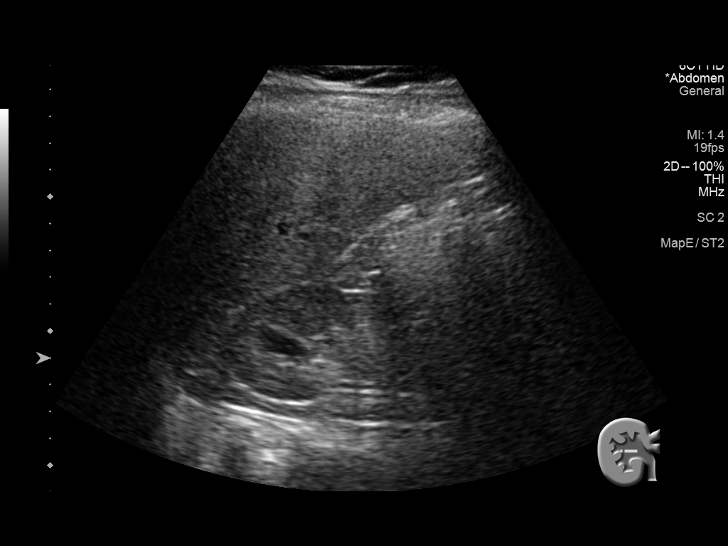
[im 11/22]
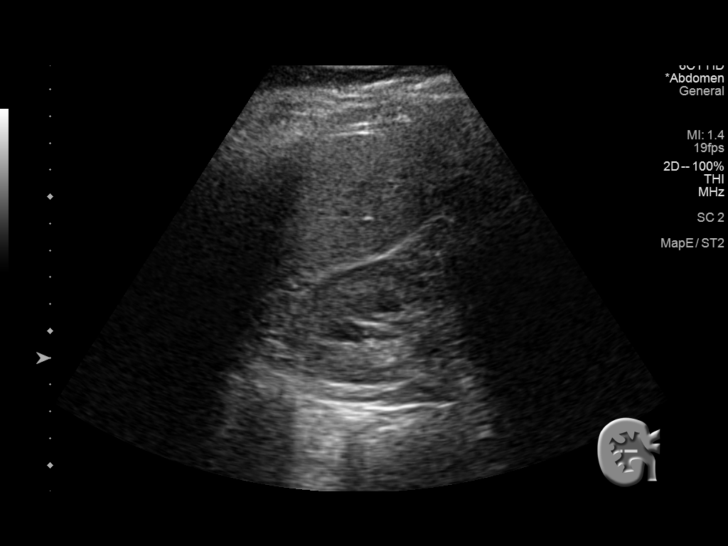
[im 12/22]
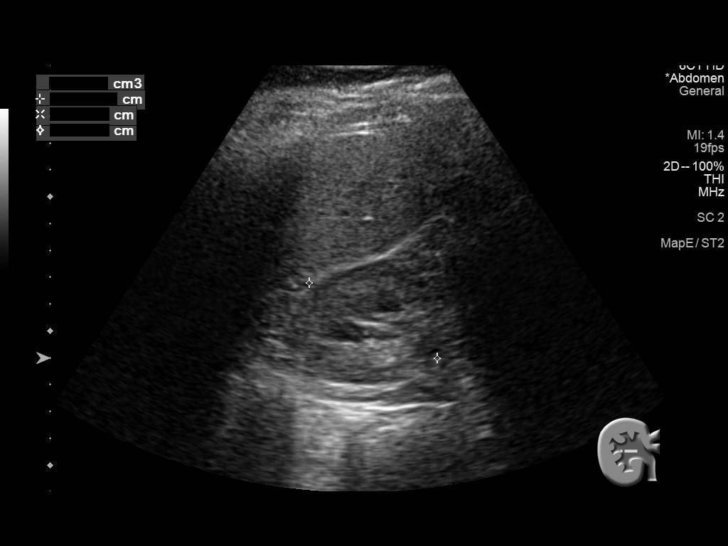
[im 14/22]
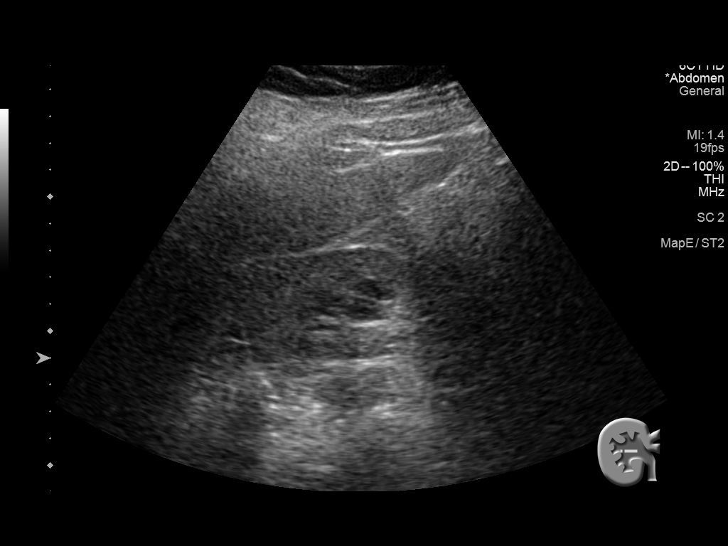
[im 15/22]
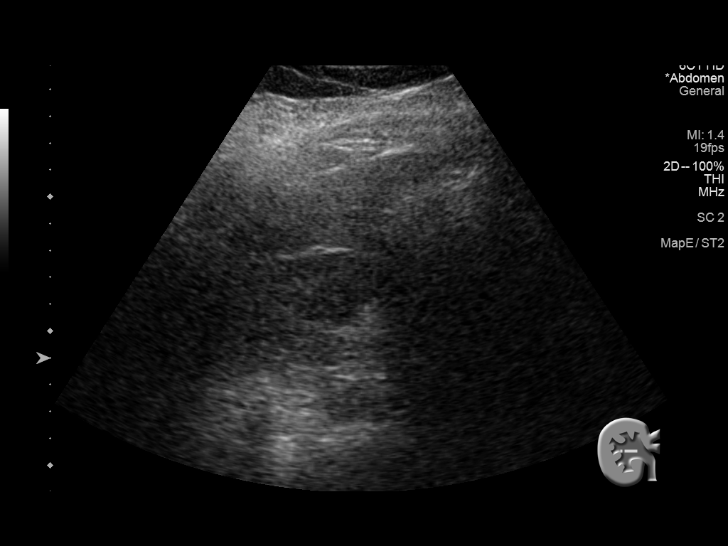
[im 17/22]
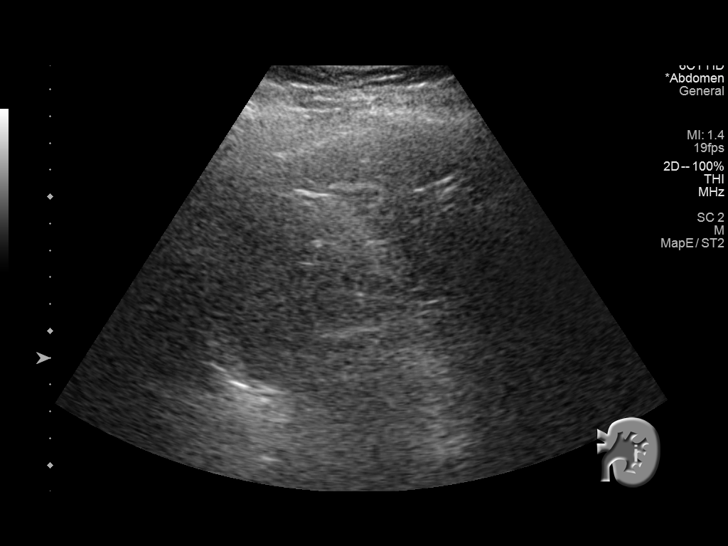
[im 19/22]
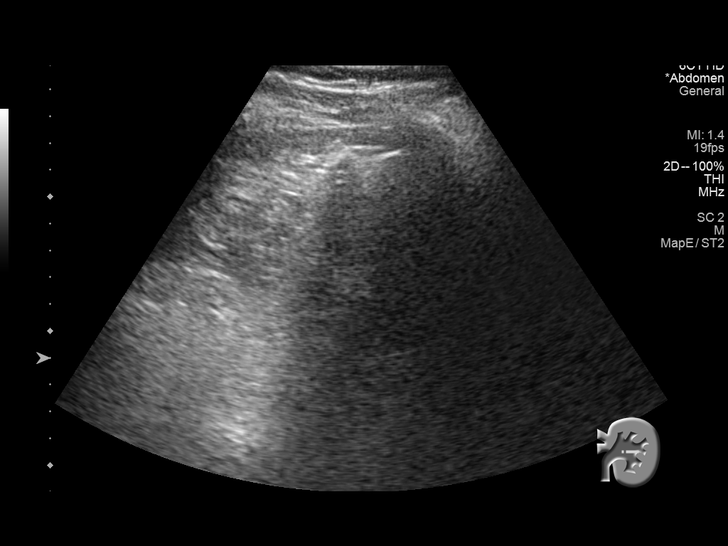
[im 20/22]
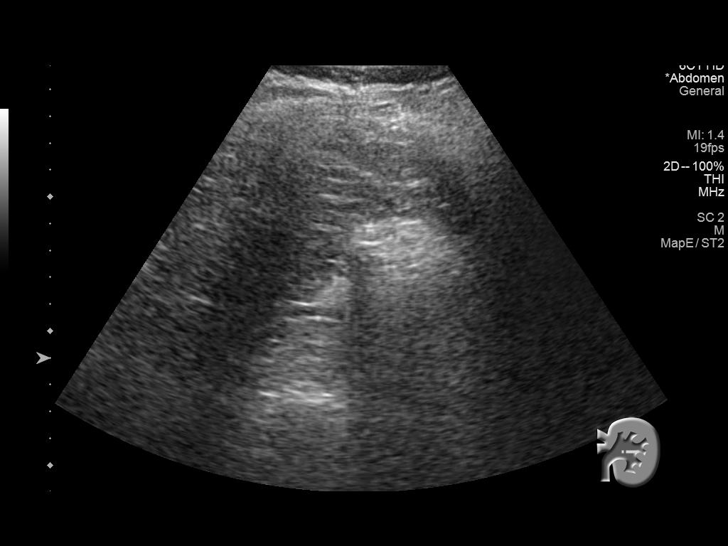
[im 22/22]
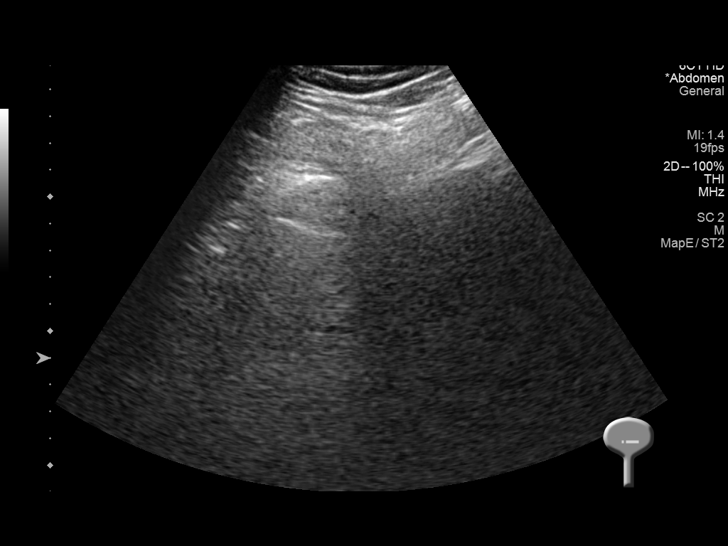

[14 of 22 positions shown; findings below may reference images not displayed]

FINDINGS: Right Kidney:

Renal measurements: 10.3 x 5.5 x 4.5 cm = volume: 135 mL. Increased
echogenicity of renal parenchyma is noted. No mass or hydronephrosis
visualized.

Left Kidney:

Not visualized.

Bladder:

Not visualized.

Other:

None.
IMPRESSION: Left kidney and urinary bladder are not visualized. Increased
echogenicity of right renal parenchyma is noted suggesting medical
renal disease. No hydronephrosis is noted currently.

## 2020-10-11 ENCOUNTER — Telehealth: Payer: Self-pay | Admitting: Family Medicine

## 2020-10-11 NOTE — Telephone Encounter (Signed)
Called mother of pt.  Pt has had seizure 2 wks ago and then one this am (starring spells) daze, hand jerking, hiccuping, not respond this am lasted .  No triggers that she can tell.  Has been taking medications as prescribed. Divalproex 625 mg po bid, keppra 15mg  po bid, and trileptal 300mg  po bid.  Last noted one in ED 08-01-20.  No recent labs per pcp.

## 2020-10-11 NOTE — Telephone Encounter (Signed)
Pt's mother, Theron Arista (on [place]) called, Had two seizures in last 2 weeks and had one this morning lasting 15 mins. He was staring out into space and not responding.  Can physician up his medication dosage.  Would like a call from the nurse.

## 2020-10-12 MED ORDER — DIVALPROEX SODIUM 125 MG PO CSDR: 725.0000 mg | DELAYED_RELEASE_CAPSULE | Freq: Two times a day (BID) | ORAL | 11 refills | Status: DC

## 2020-10-12 NOTE — Telephone Encounter (Signed)
Spoke to mother of pt and she did receive message and did relay correct dosing of divaproex sprinkles.  Will call back as needed.

## 2020-10-12 NOTE — Addendum Note (Signed)
Addended by: Shawnie Dapper L on: 10/12/2020 11:31 AM   Modules accepted: Orders

## 2020-10-12 NOTE — Telephone Encounter (Signed)
I called and LMVM for mother of pt. That increase the divalproex sprinkles 125mg  caps to 6 capsules po bid. Continue other sz medications as prescribed.  Note to be sent to Dr. for review, he is out today.  Call back for concerns or changes.

## 2020-10-12 NOTE — Telephone Encounter (Signed)
Please let mom know that I am going to increase the divalproex to 6 capsules twice daily. Continue levetiracetam and oxcarpazepine as prescribed. I will send note to Dr Marjory Lies for review as well. Have her call with any changes or concerns of side effects.

## 2020-11-23 ENCOUNTER — Telehealth: Payer: Self-pay | Admitting: Family Medicine

## 2020-11-23 NOTE — Telephone Encounter (Signed)
error 

## 2021-01-11 ENCOUNTER — Telehealth: Payer: Self-pay | Admitting: Family Medicine

## 2021-01-11 NOTE — Telephone Encounter (Addendum)
Pts mother called wanting to speak to the RN regarding the pts medications. She states that he has been very aggressive and agitated and she is wanting to know if this has something to do with the medications he was prescribed. Mother would like to be called back at work first and if she does not pick up then try cell phone. Please advise.

## 2021-01-11 NOTE — Telephone Encounter (Signed)
Called mother of the patient.  Last Friday and yesterday Tuesday had episode lasting for about an hour of fighting, enraged, hitting, throwing things.  She was questioning his seizure medications. Has been aon these keppra, depakote, oxcarbamazepine for quite sometime.   Ha had episodes wen younger now just recently  progressive worsening issue for him.  She has contacted his primary care but has not heard back relating to medication that he was on previously it looks like clonazepam.  I recommended following up with primary care for these issues or even psychiatry.  But she wanted me to ask your recommendations.  I will call her back with those.

## 2021-01-11 NOTE — Telephone Encounter (Signed)
I agree with PCP or psychiatry follow up. I would be hesitant to change medications without getting drug levels first. If she has concerns of continued seizure activity, I think it would be most helpful to get him back in the office for a revisit to address. I discussed case with Dr Marjory Lies at last follow up. Dose changes not recommended as we can not assess drug levels due to difficulty with patient complying. May be helpful if Dr Marjory Lies could see him at next visit if continued seizure activity. TY.

## 2021-01-11 NOTE — Telephone Encounter (Signed)
I returned the call to the patient's mother. She is in agreement to see his PCP to discuss treatment for his behavior, including a referral to psychiatry if recommended. She would also like for him to have an early follow up at our office. She has noticed some episodes of blank staring. Per Amy, his next appt should be with Dr. Marjory Lies. She accepted a time on 02/06/21 at 1:30 (check-in 1pm). She will call us back with any further concerns, if needed, prior to his follow up.

## 2021-02-06 ENCOUNTER — Encounter: Payer: Medicaid Other | Admitting: Diagnostic Neuroimaging

## 2021-02-06 ENCOUNTER — Telehealth (INDEPENDENT_AMBULATORY_CARE_PROVIDER_SITE_OTHER): Payer: Medicaid Other | Admitting: Diagnostic Neuroimaging

## 2021-02-06 ENCOUNTER — Encounter: Payer: Self-pay | Admitting: Diagnostic Neuroimaging

## 2021-02-06 DIAGNOSIS — R4586 Emotional lability: Secondary | ICD-10-CM

## 2021-02-06 DIAGNOSIS — Q909 Down syndrome, unspecified: Secondary | ICD-10-CM

## 2021-02-06 DIAGNOSIS — G40209 Localization-related (focal) (partial) symptomatic epilepsy and epileptic syndromes with complex partial seizures, not intractable, without status epilepticus: Secondary | ICD-10-CM

## 2021-02-06 MED ORDER — LEVETIRACETAM 100 MG/ML PO SOLN: ORAL | 3 refills | Status: DC

## 2021-02-06 MED ORDER — OXCARBAZEPINE 300 MG/5ML PO SUSP
300.0000 mg | Freq: Two times a day (BID) | ORAL | 3 refills | Status: DC
Start: 2021-02-06 — End: 2021-12-12

## 2021-02-06 MED ORDER — DIVALPROEX SODIUM 125 MG PO CSDR
725.0000 mg | DELAYED_RELEASE_CAPSULE | Freq: Two times a day (BID) | ORAL | 11 refills | Status: DC
Start: 2021-02-06 — End: 2021-12-12

## 2021-02-06 NOTE — Progress Notes (Unsigned)
This encounter was created in error - please disregard.

## 2021-02-06 NOTE — Progress Notes (Signed)
Virtual Visit via Telephone Note  I connected with@ on 02/06/21 at  1:30 PM EDT by telephone and verified that I am speaking with the correct person using two identifiers.   I discussed the limitations, risks, security and privacy concerns of performing an evaluation and management service by telephone and the availability of in person appointments. I also discussed with the patient that there may be a patient responsible charge related to this service. The patient expressed understanding and agreed to proceed. Patient is at home and I am at the office.    History of Present Illness:  UPDATE (02/06/21, VRP): Since last visit, having more staring spells seizures, weight gain, mood fluctuations.  He is tending to get agitated and throw things when he comes out of seizures.  Today patient had office visit but did not want to come into the exam room.  Patient and mother stayed left and we converted to a phone visit. Symptoms are progressive.     UPDATE (01/13/18, VRP): Since last visit, doingwell. Toleratinghigher dose of depakote. No alleviating or aggravating factors.Some increased weight. No seizures since Oct 2018.  UPDATE (09/11/17, MM):35 year old male with a history of seizure events. He returns today for an evaluation. His mom reports that he had a seizure on Saturday. Reports that it was a generalized seizure. He had convulsing in the extremities with loss of consciousness. She states that histongue was also purple. The seizure lasted approximately 3 minutes. She is unsure if he lostbowels orbladder as he wears depends regularly. She denies missing any doses. She does state that the power was outThursday and Friday. Thereforethe patient did not sleep much because of this. He returns today for an evaluation.  UPDATE 02/20/17: Since last visit, had 1 seizure in Feb 2018; mother saw patient him after seizure and patient breathing heavy and seemed more confusion. No triggering  factors.   UPDATE 08/07/16: Since last visit, doing well. No more seizures since starting depakote sprinkles.   UPDATE 03/19/16: Since last visit, was doing well until Feb 2017, then had 4 seizures since that time. No triggering factors. Sz were staring then convulsive.  UPDATE 09/26/15: Since last visit, was doing well until 09/17/15 (breakthrough sz). Tolerating incr LEV 1000mg  BID dose.   UPDATE 06/23/15: Since last visit, was doing well until Feb 2016 (had 2nd seizure). Then went to ER and started on LEV 500mg  BID. Then had addl sz in April, June and July 2016. Some warning, as patient would go to sofa and brace himself, then mouth and face twitching, then generalized convulsions, gasping for breath, then stopped. Confusion for 1-2 hours, then returns to baseline. Tolerating LEV.   PRIOR HPI (11/16/14): 35 year old male with history of Down syndrome, congenital solitary kidney, here for evaluation of seizure. No right or history of seizures until 11/10/14, patient was eating breakfast when all of a sudden he made gurgling sounds, had convulsions, fell out of his chair. Seizure lasted for 5 minutes. Patient did have tongue biting with the seizure. He was confused, dazed, for another 15-20 minutes. Patient was gradually returned to baseline by the time he was loaded into the ambulance and came to the emergency room. No recent sleep changes, infections, stresses, traumas.     Observations/Objective:  Phone visit   Assessment and Plan:  Dx:  1. Partial symptomatic epilepsy with complex partial seizures, not intractable, without status epilepticus (HCC)   2. Down syndrome   3. 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 Ambulatory referral to Psychiatry   Meds ordered this encounter  Medications  . divalproex (DEPAKOTE SPRINKLE) 125 MG capsule    Sig: Take 6 capsules (750 mg total) by mouth 2 (two) times daily.    Dispense:  360 capsule    Refill:  11  . levETIRAcetam (KEPPRA) 100 MG/ML solution    Sig: TAKE 15 MLS BY MOUTH 2 (TWO) TIMES DAILY.    Dispense:  2700 mL    Refill:  3  . OXcarbazepine (TRILEPTAL) 300 MG/5ML suspension    Sig: Take 5 mLs (300 mg total) by mouth 2 (two) times daily.    Dispense:  900 mL    Refill:  3    Follow Up Instructions:  - Return in about 1 year (around 02/06/2022) for with NP (Amy Lomax).   I discussed the assessment and treatment plan with the patient. The patient was provided an opportunity to ask questions and all were answered. The patient agreed with the plan and demonstrated an understanding of the instructions.   The patient was advised to call back or seek an in-person evaluation if the symptoms worsen or if the condition fails to improve as anticipated.  I provided 15 minutes of non-face-to-face time during this encounter.    Suanne Marker, MD 02/06/2021, 1:24 PM Certified in Neurology, Neurophysiology and Neuroimaging  [facility] [address] [place], [place] [address] [phone]

## 2021-07-13 ENCOUNTER — Ambulatory Visit: Payer: Medicaid Other | Admitting: Family Medicine

## 2021-10-19 DIAGNOSIS — A419 Sepsis, unspecified organism: Secondary | ICD-10-CM

## 2021-10-19 HISTORY — DX: Sepsis, unspecified organism: A41.9

## 2021-10-30 ENCOUNTER — Telehealth: Payer: Self-pay | Admitting: Family Medicine

## 2021-10-30 NOTE — Telephone Encounter (Signed)
Pt's mother, Clearence Cheek (on [place]) called, would like a call from the nurse to discuss behavioral health care. His behavior has changed a lot he throws things, being aggressive. Called EMS last night and took him to [facility]. They prescribed medication for one week and recommended he be evaluated.

## 2021-11-01 ENCOUNTER — Ambulatory Visit: Payer: Medicaid Other | Admitting: Family Medicine

## 2021-11-02 NOTE — Telephone Encounter (Signed)
Referral has been sent to [facility]iat[facility].

## 2021-12-12 ENCOUNTER — Ambulatory Visit: Payer: Medicaid Other | Admitting: Diagnostic Neuroimaging

## 2021-12-12 ENCOUNTER — Other Ambulatory Visit: Payer: Self-pay

## 2021-12-12 ENCOUNTER — Encounter: Payer: Self-pay | Admitting: Diagnostic Neuroimaging

## 2021-12-12 VITALS — Ht 60.0 in | Wt 148.0 lb

## 2021-12-12 DIAGNOSIS — G40209 Localization-related (focal) (partial) symptomatic epilepsy and epileptic syndromes with complex partial seizures, not intractable, without status epilepticus: Secondary | ICD-10-CM | POA: Diagnosis not present

## 2021-12-12 DIAGNOSIS — Q909 Down syndrome, unspecified: Secondary | ICD-10-CM

## 2021-12-12 MED ORDER — SERTRALINE HCL 50 MG PO TABS: 50.0000 mg | ORAL_TABLET | Freq: Every day | ORAL | 4 refills | Status: DC

## 2021-12-12 MED ORDER — LEVETIRACETAM 100 MG/ML PO SOLN: 1000.0000 mg | Freq: Two times a day (BID) | ORAL | 3 refills | Status: DC

## 2021-12-12 MED ORDER — LEVETIRACETAM 100 MG/ML PO SOLN: ORAL | 3 refills | Status: DC

## 2021-12-12 MED ORDER — OXCARBAZEPINE 300 MG/5ML PO SUSP: 300.0000 mg | Freq: Two times a day (BID) | ORAL | 3 refills | Status: DC

## 2021-12-12 MED ORDER — DIVALPROEX SODIUM 125 MG PO CSDR: 625.0000 mg | DELAYED_RELEASE_CAPSULE | Freq: Two times a day (BID) | ORAL | 4 refills | Status: DC

## 2021-12-12 NOTE — Progress Notes (Signed)
Chief Complaint  Patient presents with   Epilepsy    Rm 7, one year FU, [place]- mom, Evelina- caregiver "recent hospitalization for sepsis- no seizures"     History of Present Illness:  UPDATE (12/12/21, VRP): Since last visit, staring spells improved. Mood and agitation improved slightly on sertraline. No major seizures. Was in hospital in Dec 2022 for sepsis.   UPDATE (02/06/21, VRP): Since last visit, having more staring spells seizures, weight gain, mood fluctuations.  He is tending to get agitated and throw things when he comes out of seizures.  Today patient had office visit but did not want to come into the exam room.  Patient and mother stayed left and we converted to a phone visit. Symptoms are progressive.     UPDATE (01/13/18, VRP): Since last visit, doing well. Tolerating higher dose of depakote. No alleviating or aggravating factors. Some increased weight. No seizures since Oct 2018.   UPDATE (09/11/17, MM): 36 year old male with a history of seizure events. He returns today for an evaluation. His mom reports that he had a seizure on Saturday. Reports that it was a generalized seizure. He had convulsing in the extremities with loss of consciousness. She states that his tongue was also purple. The seizure lasted approximately 3 minutes. She is unsure if he lost bowels or  bladder as he wears depends regularly. She denies missing any doses. She does state that the power was out Thursday and Friday. Therefore the patient did not sleep much because of this. He returns today for an evaluation.   UPDATE 02/20/17: Since last visit, had 1 seizure in Feb 2018; mother saw patient him after seizure and patient breathing heavy and seemed more confusion. No triggering factors.    UPDATE 08/07/16: Since last visit, doing well. No more seizures since starting depakote sprinkles.    UPDATE 03/19/16: Since last visit, was doing well until Feb 2017, then had 4 seizures since that time. No triggering  factors. Sz were staring then convulsive.   UPDATE 09/26/15: Since last visit, was doing well until 09/17/15 (breakthrough sz). Tolerating incr LEV 1000mg  BID dose.    UPDATE 06/23/15: Since last visit, was doing well until Feb 2016 (had 2nd seizure). Then went to ER and started on LEV 500mg  BID. Then had addl sz in April, June and July 2016. Some warning, as patient would go to sofa and brace himself, then mouth and face twitching, then generalized convulsions, gasping for breath, then stopped. Confusion for 1-2 hours, then returns to baseline. Tolerating LEV.    PRIOR HPI (11/16/14): 36 year old male with history of Down syndrome, congenital solitary kidney, here for evaluation of seizure. No right or history of seizures until 11/10/14, patient was eating breakfast when all of a sudden he made gurgling sounds, had convulsions, fell out of his chair. Seizure lasted for 5 minutes. Patient did have tongue biting with the seizure. He was confused, dazed, for another 15-20 minutes. Patient was gradually returned to baseline by the time he was loaded into the ambulance and came to the emergency room. No recent sleep changes, infections, stresses, traumas.      Observations/Objective:  GENERAL EXAM/CONSTITUTIONAL: Vitals:  Vitals:   12/12/21 0817  Weight: 148 lb (67.1 kg)  Height: 5' (1.524 m)   Body mass index is 28.9 kg/m. Wt Readings from Last 3 Encounters:  12/12/21 148 lb (67.1 kg)  07/13/20 176 lb (79.8 kg)  01/21/20 168 lb (76.2 kg)   Patient is in no distress;  well developed, nourished and groomed; neck is supple  CARDIOVASCULAR: Examination of carotid arteries is normal; no carotid bruits Regular rate and rhythm, no murmurs Examination of peripheral vascular system by observation and palpation is normal  EYES: Ophthalmoscopic exam of optic discs and posterior segments is normal; no papilledema or hemorrhages No results found.  MUSCULOSKELETAL: Gait, strength, tone, movements  noted in Neurologic exam below  NEUROLOGIC: MENTAL STATUS:  No flowsheet data found. awake, alert, oriented to person DECR FLUENCY; FOLLOWING SIMPLE COMMANDS  CRANIAL NERVE:  2nd, 3rd, 4th, 6th - pupils equal and reactive to light, visual fields full to confrontation, extraocular muscles intact, no nystagmus 5th - facial sensation symmetric 7th - facial strength symmetric 8th - hearing intact 9th - palate elevates symmetrically, uvula midline 11th - shoulder shrug symmetric 12th - tongue protrusion midline  MOTOR:  normal bulk and tone, full strength in the BUE, BLE  SENSORY:  normal and symmetric to light touch  COORDINATION:  finger-nose-finger, fine finger movements SLOW  REFLEXES:  deep tendon reflexes present and symmetric  GAIT/STATION:  narrow based gait    Assessment and Plan:  Dx:  1. Down syndrome   2. 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    Dispense:  900 capsule    Refill:  4   DISCONTD: levETIRAcetam (KEPPRA) 100 MG/ML solution    Sig: TAKE 15 MLS BY MOUTH 2 (TWO) TIMES DAILY.    Dispense:  2700 mL    Refill:  3   OXcarbazepine (TRILEPTAL) 300 MG/5ML suspension    Sig: Take 5 mLs (300 mg total) by mouth 2 (two) times daily.    Dispense:  900 mL    Refill:  3   sertraline (ZOLOFT) 50 MG tablet     Sig: Take 1 tablet (50 mg total) by mouth daily.    Dispense:  90 tablet    Refill:  4   levETIRAcetam (KEPPRA) 100 MG/ML solution    Sig: Take 10 mLs (1,000 mg total) by mouth 2 (two) times daily.    Dispense:  2700 mL    Refill:  3    Follow Up Instructions:  - Return in about 1 year (around 12/12/2022) for MyChart visit (15 min).    , MD 12/12/2021, 9:03 AM Certified in Neurology, Neurophysiology and Neuroimaging  [facility] [address] [place], 12/14/2021 [facility] [phone]

## 2021-12-12 NOTE — Patient Instructions (Addendum)
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

## 2022-11-06 ENCOUNTER — Other Ambulatory Visit: Payer: Self-pay | Admitting: Diagnostic Neuroimaging

## 2022-12-17 ENCOUNTER — Telehealth: Payer: Medicaid Other | Admitting: Diagnostic Neuroimaging

## 2023-01-14 ENCOUNTER — Other Ambulatory Visit: Payer: Self-pay | Admitting: Diagnostic Neuroimaging

## 2023-01-14 DIAGNOSIS — G40209 Localization-related (focal) (partial) symptomatic epilepsy and epileptic syndromes with complex partial seizures, not intractable, without status epilepticus: Secondary | ICD-10-CM

## 2023-01-29 ENCOUNTER — Encounter: Payer: Self-pay | Admitting: Diagnostic Neuroimaging

## 2023-01-29 ENCOUNTER — Telehealth (INDEPENDENT_AMBULATORY_CARE_PROVIDER_SITE_OTHER): Payer: Medicaid Other | Admitting: Diagnostic Neuroimaging

## 2023-01-29 DIAGNOSIS — G40209 Localization-related (focal) (partial) symptomatic epilepsy and epileptic syndromes with complex partial seizures, not intractable, without status epilepticus: Secondary | ICD-10-CM | POA: Diagnosis not present

## 2023-01-29 MED ORDER — DIVALPROEX SODIUM 125 MG PO CSDR: 625.0000 mg | DELAYED_RELEASE_CAPSULE | Freq: Two times a day (BID) | ORAL | 4 refills | Status: AC

## 2023-01-29 MED ORDER — OXCARBAZEPINE 300 MG/5ML PO SUSP: 300.0000 mg | Freq: Two times a day (BID) | ORAL | 12 refills | Status: DC

## 2023-01-29 MED ORDER — LEVETIRACETAM 100 MG/ML PO SOLN: 1000.0000 mg | Freq: Two times a day (BID) | ORAL | 12 refills | Status: DC

## 2023-01-29 NOTE — Progress Notes (Signed)
Chief Complaint  Patient presents with   Seizures    History of Present Illness:  UPDATE (01/29/23, VRP): Since last visit, doing well. Symptoms are stable. Severity is mild. No major seizures. Some minor dazing spells. No alleviating or aggravating factors. Tolerating meds.    UPDATE (12/12/21, VRP): Since last visit, staring spells improved. Mood and agitation improved slightly on sertraline. No major seizures. Was in hospital in Dec 2022 for sepsis.   UPDATE (02/06/21, VRP): Since last visit, having more staring spells seizures, weight gain, mood fluctuations.  He is tending to get agitated and throw things when he comes out of seizures.  Today patient had office visit but did not want to come into the exam room.  Patient and mother stayed left and we converted to a phone visit. Symptoms are progressive.     UPDATE (01/13/18, VRP): Since last visit, doing well. Tolerating higher dose of depakote. No alleviating or aggravating factors. Some increased weight. No seizures since Oct 2018.   UPDATE (09/11/17, MM): 37 year old male with a history of seizure events. He returns today for an evaluation. His mom reports that he had a seizure on Saturday. Reports that it was a generalized seizure. He had convulsing in the extremities with loss of consciousness. She states that his tongue was also purple. The seizure lasted approximately 3 minutes. She is unsure if he lost bowels or  bladder as he wears depends regularly. She denies missing any doses. She does state that the power was out Thursday and Friday. Therefore the patient did not sleep much because of this. He returns today for an evaluation.   UPDATE 02/20/17: Since last visit, had 1 seizure in Feb 2018; mother saw patient him after seizure and patient breathing heavy and seemed more confusion. No triggering factors.    UPDATE 08/07/16: Since last visit, doing well. No more seizures since starting depakote sprinkles.    UPDATE 03/19/16: Since  last visit, was doing well until Feb 2017, then had 4 seizures since that time. No triggering factors. Sz were staring then convulsive.   UPDATE 09/26/15: Since last visit, was doing well until 09/17/15 (breakthrough sz). Tolerating incr LEV '1000mg'$  BID dose.    UPDATE 06/23/15: Since last visit, was doing well until Feb 2016 (had 2nd seizure). Then went to ER and started on LEV '500mg'$  BID. Then had addl sz in April, June and July 2016. Some warning, as patient would go to sofa and brace himself, then mouth and face twitching, then generalized convulsions, gasping for breath, then stopped. Confusion for 1-2 hours, then returns to baseline. Tolerating LEV.    PRIOR HPI (11/16/14): 37 year old male with history of Down syndrome, congenital solitary kidney, here for evaluation of seizure. No right or history of seizures until 11/10/14, patient was eating breakfast when all of a sudden he made gurgling sounds, had convulsions, fell out of his chair. Seizure lasted for 5 minutes. Patient did have tongue biting with the seizure. He was confused, dazed, for another 15-20 minutes. Patient was gradually returned to baseline by the time he was loaded into the ambulance and came to the emergency room. No recent sleep changes, infections, stresses, traumas.      Observations/Objective:  Video visit   Assessment and Plan:  Dx:  1. 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    Dispense:  900 capsule    Refill:  4   levETIRAcetam (KEPPRA) 100 MG/ML solution    Sig: Take 10 mLs (1,000 mg total) by mouth 2 (two) times daily.    Dispense:  600 mL    Refill:  12   OXcarbazepine (TRILEPTAL) 300 MG/5ML suspension    Sig: Take 5 mLs (300 mg total) by mouth 2 (two) times daily.    Dispense:  300 mL    Refill:  12    Follow Up Instructions:  - Return in about 1 year (around 01/29/2024) for MyChart visit (15 min).   Virtual Visit via Video Note  I connected with Isaac Murphy on 01/29/23 at  2:15 PM EDT by a video enabled telemedicine application and verified that I am speaking with the correct person using two identifiers.   I discussed the limitations of evaluation and management by telemedicine and the availability of in person appointments. The patient expressed understanding and agreed to proceed.  Patient is at home and I am at the office.   I spent 15 minutes of face-to-face and non-face-to-face time with patient.  This included previsit chart review, lab review, study review, order entry, electronic health record documentation, patient education.       Penni Bombard, MD [id number], [id number] PM Certified in Neurology, Neurophysiology and Neuroimaging  [facility] [address] [place], [place] [address] [phone]

## 2023-05-20 ENCOUNTER — Telehealth: Payer: Self-pay | Admitting: Diagnostic Neuroimaging

## 2023-05-20 NOTE — Telephone Encounter (Signed)
Pt's mother reports that pt is supposed to take : levETIRAcetam (KEPPRA) 100 MG/ML solution,  divalproex (DEPAKOTE SPRINKLE) 125 MG capsule  &  OXcarbazepine (TRILEPTAL) 300 MG/5ML suspension twice a day.  Mother states pt will only take the meds once a day.  Mother is asking if pt can just be given all of his medications at once, please call.

## 2023-05-20 NOTE — Telephone Encounter (Signed)
Called the mom back. She typically would give the patient his medication in applesauce BID. In the last week he has stopped taking his medication. She states that she can usually get him to take it in the morning but he has been refusing to take evening dose. She states this morning he wouldn't take his medication. She is wanting to know if she can give both doses at once or if there is a way to switch to once a day. Advised I would discuss with MD and let her know.

## 2023-05-27 ENCOUNTER — Encounter: Payer: Self-pay | Admitting: Diagnostic Neuroimaging

## 2023-05-27 NOTE — Telephone Encounter (Signed)
Pt mother wants to talk to nurse about changing pt medication. Stated pt will only take medication one time a day. Stated she doesn't know why pt want take medication.

## 2023-08-20 ENCOUNTER — Telehealth: Payer: Self-pay | Admitting: Diagnostic Neuroimaging

## 2023-08-20 NOTE — Telephone Encounter (Signed)
Pt' s mother, Theron Arista called in regards to pt's seizure. Pt had a seizure Sunday. Was taken to the hospital Pt was asleep when he had the seizure. The physician suggested since having a problem getting him to take twice a day, they suggested if can get him to take once a day. Tried to give it to him in his food or drink and he would not take it. Would like a call from the nurse to discuss what to do next.

## 2023-08-20 NOTE — Telephone Encounter (Signed)
Contacted pt mother back, reiterated to her that Liquid meds should be taked twice a day. We are not able to reduce that medication to one a day. The liquid form has to be split in two doses because if he took one dose it would be too much for him at one time and it would wear off by the 12 hrs mark when he would be due for another dose. In other words even if you were to double the dose and get him to take it in the morning the solution half life it only good for 12 hrs and then its time to take another dose. It would be too much to double the dose at once and it would still wear off.  Dr Marjory Lies previously suggested if mother can't get him to take all his medications try at least focusing on getting one medication in twice a day. At this point, it is trying to get him to take what he can. She did ask about the tablet form. I informed her tablet form cannot be crushed for him to swallow. She has to do this best she can and try to keep reminding him if he does not take it, he will have more seizures and be in the hospital. She verbally understood and was appreciative.

## 2023-10-16 ENCOUNTER — Other Ambulatory Visit: Payer: Self-pay | Admitting: Nephrology

## 2023-10-16 DIAGNOSIS — Z87718 Personal history of other specified (corrected) congenital malformations of genitourinary system: Secondary | ICD-10-CM

## 2023-10-23 ENCOUNTER — Ambulatory Visit
Admission: RE | Admit: 2023-10-23 | Discharge: 2023-10-23 | Disposition: A | Discharge status: Home / Self Care | Payer: Medicaid Other | Source: Ambulatory Visit | Attending: Nephrology

## 2023-10-23 DIAGNOSIS — Z87718 Personal history of other specified (corrected) congenital malformations of genitourinary system: Secondary | ICD-10-CM

## 2023-12-24 ENCOUNTER — Other Ambulatory Visit: Payer: Self-pay | Admitting: Diagnostic Neuroimaging

## 2023-12-24 DIAGNOSIS — G40209 Localization-related (focal) (partial) symptomatic epilepsy and epileptic syndromes with complex partial seizures, not intractable, without status epilepticus: Secondary | ICD-10-CM

## 2023-12-25 NOTE — Telephone Encounter (Signed)
 Last seen on 01/29/23 No follow up scheduled   Per note " Return in about 1 year (around 01/29/2024) for MyChart visit (15 min). "

## 2024-03-30 ENCOUNTER — Other Ambulatory Visit: Payer: Self-pay | Admitting: Diagnostic Neuroimaging

## 2024-05-06 ENCOUNTER — Other Ambulatory Visit: Payer: Self-pay | Admitting: Diagnostic Neuroimaging

## 2024-05-15 NOTE — Telephone Encounter (Signed)
 Last seen on 01/29/23 per note   Return in about 1 year (around 01/29/2024) for MyChart visit (15 min). No follow up scheduled

## 2024-06-01 ENCOUNTER — Other Ambulatory Visit: Payer: Self-pay | Admitting: Diagnostic Neuroimaging

## 2024-06-01 DIAGNOSIS — G40209 Localization-related (focal) (partial) symptomatic epilepsy and epileptic syndromes with complex partial seizures, not intractable, without status epilepticus: Secondary | ICD-10-CM

## 2024-06-04 ENCOUNTER — Other Ambulatory Visit: Payer: Self-pay

## 2024-06-04 DIAGNOSIS — G40209 Localization-related (focal) (partial) symptomatic epilepsy and epileptic syndromes with complex partial seizures, not intractable, without status epilepticus: Secondary | ICD-10-CM

## 2024-06-04 MED ORDER — LEVETIRACETAM 100 MG/ML PO SOLN: ORAL | 4 refills | Status: AC

## 2024-08-31 ENCOUNTER — Other Ambulatory Visit: Payer: Self-pay | Admitting: Diagnostic Neuroimaging

## 2024-08-31 NOTE — Telephone Encounter (Signed)
 Last filled by patient on 08/06/24 Last office visit : 01/29/23 Next office visit :  was due back 01/29/24 - continue trileptal  300mg  twice a day

## 2024-09-09 ENCOUNTER — Other Ambulatory Visit: Payer: Self-pay | Admitting: Diagnostic Neuroimaging

## 2024-09-30 ENCOUNTER — Other Ambulatory Visit: Payer: Self-pay | Admitting: Diagnostic Neuroimaging

## 2024-10-05 MED ORDER — OXCARBAZEPINE 300 MG/5ML PO SUSP: 300.0000 mg | Freq: Every day | ORAL | 0 refills | Status: AC

## 2024-10-05 NOTE — Addendum Note (Signed)
 Addended by: LOVENIA JESUSA SAUNDERS on: 10/05/2024 03:07 PM   Modules accepted: Orders

## 2024-10-05 NOTE — Telephone Encounter (Signed)
 Called and LVM for pt to call back and schedule a f/u appt.

## 2024-10-05 NOTE — Telephone Encounter (Signed)
 Please contact patient to schedule. MyChart not recently active.

## 2024-12-10 ENCOUNTER — Other Ambulatory Visit: Payer: Self-pay | Admitting: Diagnostic Neuroimaging

## 2024-12-10 DIAGNOSIS — G40209 Localization-related (focal) (partial) symptomatic epilepsy and epileptic syndromes with complex partial seizures, not intractable, without status epilepticus: Secondary | ICD-10-CM

## 2024-12-10 NOTE — Telephone Encounter (Signed)
 Patient moved to [facility] in July and is unable to come to the office for further care.
